# Patient Record
Sex: Male | Born: 1950
Health system: Southern US, Community
[De-identification: ages and names within clinical notes are randomized; demographics above are authoritative.]

## PROBLEM LIST (undated history)

## (undated) DIAGNOSIS — C61 Malignant neoplasm of prostate: Secondary | ICD-10-CM

## (undated) DIAGNOSIS — N400 Enlarged prostate without lower urinary tract symptoms: Secondary | ICD-10-CM

## (undated) DIAGNOSIS — K4020 Bilateral inguinal hernia, without obstruction or gangrene, not specified as recurrent: Secondary | ICD-10-CM

## (undated) DIAGNOSIS — Z960 Presence of urogenital implants: Secondary | ICD-10-CM

## (undated) HISTORY — PX: APPENDECTOMY: SHX54

## (undated) HISTORY — PX: NEPHRECTOMY: SHX65

---

## 1978-11-04 HISTORY — PX: HERNIA REPAIR: SHX51

## 2016-12-09 DIAGNOSIS — H3589 Other specified retinal disorders: Secondary | ICD-10-CM | POA: Diagnosis not present

## 2016-12-09 DIAGNOSIS — H40023 Open angle with borderline findings, high risk, bilateral: Secondary | ICD-10-CM | POA: Diagnosis not present

## 2016-12-09 DIAGNOSIS — H25043 Posterior subcapsular polar age-related cataract, bilateral: Secondary | ICD-10-CM | POA: Diagnosis not present

## 2016-12-09 DIAGNOSIS — H11153 Pinguecula, bilateral: Secondary | ICD-10-CM | POA: Diagnosis not present

## 2016-12-09 DIAGNOSIS — H40033 Anatomical narrow angle, bilateral: Secondary | ICD-10-CM | POA: Diagnosis not present

## 2016-12-09 DIAGNOSIS — H25013 Cortical age-related cataract, bilateral: Secondary | ICD-10-CM | POA: Diagnosis not present

## 2016-12-09 DIAGNOSIS — H18413 Arcus senilis, bilateral: Secondary | ICD-10-CM | POA: Diagnosis not present

## 2016-12-09 DIAGNOSIS — H2513 Age-related nuclear cataract, bilateral: Secondary | ICD-10-CM | POA: Diagnosis not present

## 2016-12-09 DIAGNOSIS — H353131 Nonexudative age-related macular degeneration, bilateral, early dry stage: Secondary | ICD-10-CM | POA: Diagnosis not present

## 2016-12-09 DIAGNOSIS — H11423 Conjunctival edema, bilateral: Secondary | ICD-10-CM | POA: Diagnosis not present

## 2016-12-31 DIAGNOSIS — H40013 Open angle with borderline findings, low risk, bilateral: Secondary | ICD-10-CM | POA: Diagnosis not present

## 2016-12-31 DIAGNOSIS — H40033 Anatomical narrow angle, bilateral: Secondary | ICD-10-CM | POA: Diagnosis not present

## 2016-12-31 DIAGNOSIS — H2513 Age-related nuclear cataract, bilateral: Secondary | ICD-10-CM | POA: Diagnosis not present

## 2017-07-14 DIAGNOSIS — H40013 Open angle with borderline findings, low risk, bilateral: Secondary | ICD-10-CM | POA: Diagnosis not present

## 2017-07-14 DIAGNOSIS — H2513 Age-related nuclear cataract, bilateral: Secondary | ICD-10-CM | POA: Diagnosis not present

## 2017-07-14 DIAGNOSIS — H40033 Anatomical narrow angle, bilateral: Secondary | ICD-10-CM | POA: Diagnosis not present

## 2017-11-03 ENCOUNTER — Other Ambulatory Visit: Payer: Self-pay

## 2017-11-03 ENCOUNTER — Emergency Department (HOSPITAL_COMMUNITY)
Admission: EM | Admit: 2017-11-03 | Discharge: 2017-11-03 | Disposition: A | Discharge status: Home / Self Care | Payer: Medicare Other | Attending: Emergency Medicine | Admitting: Emergency Medicine

## 2017-11-03 ENCOUNTER — Encounter (HOSPITAL_COMMUNITY): Payer: Self-pay | Admitting: Emergency Medicine

## 2017-11-03 DIAGNOSIS — Z79899 Other long term (current) drug therapy: Secondary | ICD-10-CM | POA: Insufficient documentation

## 2017-11-03 DIAGNOSIS — R338 Other retention of urine: Secondary | ICD-10-CM | POA: Diagnosis not present

## 2017-11-03 DIAGNOSIS — R339 Retention of urine, unspecified: Secondary | ICD-10-CM | POA: Diagnosis not present

## 2017-11-03 DIAGNOSIS — N401 Enlarged prostate with lower urinary tract symptoms: Secondary | ICD-10-CM | POA: Diagnosis not present

## 2017-11-03 DIAGNOSIS — R1030 Lower abdominal pain, unspecified: Secondary | ICD-10-CM | POA: Diagnosis present

## 2017-11-03 DIAGNOSIS — R39198 Other difficulties with micturition: Secondary | ICD-10-CM | POA: Insufficient documentation

## 2017-11-03 HISTORY — DX: Benign prostatic hyperplasia without lower urinary tract symptoms: N40.0

## 2017-11-03 LAB — URINALYSIS, ROUTINE W REFLEX MICROSCOPIC
BILIRUBIN URINE: NEGATIVE
GLUCOSE, UA: NEGATIVE mg/dL
Ketones, ur: 5 mg/dL — AB
LEUKOCYTES UA: NEGATIVE
NITRITE: NEGATIVE
PH: 6 (ref 5.0–8.0)
Protein, ur: NEGATIVE mg/dL
SPECIFIC GRAVITY, URINE: 1.012 (ref 1.005–1.030)
SQUAMOUS EPITHELIAL / LPF: NONE SEEN

## 2017-11-03 LAB — BASIC METABOLIC PANEL
Anion gap: 9 (ref 5–15)
BUN: 13 mg/dL (ref 6–20)
CO2: 24 mmol/L (ref 22–32)
CREATININE: 0.95 mg/dL (ref 0.61–1.24)
Calcium: 9.2 mg/dL (ref 8.9–10.3)
Chloride: 103 mmol/L (ref 101–111)
GFR calc non Af Amer: >60 mL/min (ref >60)
Glucose, Bld: 118 mg/dL — ABNORMAL HIGH (ref 65–99)
Potassium: 4 mmol/L (ref 3.5–5.1)
Sodium: 136 mmol/L (ref 135–145)

## 2017-11-03 LAB — CBC
HCT: 42.6 % (ref 39.0–52.0)
Hemoglobin: 14.8 g/dL (ref 13.0–17.0)
MCH: 32.9 pg (ref 26.0–34.0)
MCHC: 34.7 g/dL (ref 30.0–36.0)
MCV: 94.7 fL (ref 78.0–100.0)
PLATELETS: 212 10*3/uL (ref 150–400)
RBC: 4.5 MIL/uL (ref 4.22–5.81)
RDW: 13 % (ref 11.5–15.5)
WBC: 10.2 10*3/uL (ref 4.0–10.5)

## 2017-11-03 MED ORDER — TAMSULOSIN HCL 0.4 MG PO CAPS: 0.4000 mg | ORAL_CAPSULE | Freq: Every day | ORAL | 0 refills | Status: AC

## 2017-11-03 NOTE — ED Notes (Signed)
Pt standard drainage bag removed and leg bag applied. Foley care reviewed with pt and spouse.

## 2017-11-03 NOTE — ED Triage Notes (Signed)
Pt reports hx of prostate enlargement with some issues urinating usually early in the am, reports this am around 0500 had a small dribble but has not been able to urinate since, c/o pressure to lower abdomen.

## 2017-11-03 NOTE — ED Provider Notes (Signed)
[facility] EMERGENCY DEPARTMENT Provider Note   CSN: [id number] Arrival date & time: 11/03/17  1341     History   Chief Complaint Chief Complaint  Patient presents with  . Urinary Retention    HPI Phillip Brown is a 66 y.o. male.  HPI  65 year old male presents with urinary retention. Patient has not been able to urinate since 3 am this morning. Has had increasing trouble urinating for 2-3 days. Has chronic prostate problems, treats this with herbal meds. No dysuria or hematuria. Has lower abdominal pain/discomfort, worse with laying or sitting. No back/flank pain. No fevers. Has never had urinary retention before.   Past Medical History:  Diagnosis Date  . BPH (benign prostatic hyperplasia)     There are no active problems to display for this patient.   Past Surgical History:  Procedure Laterality Date  . APPENDECTOMY    . HERNIA REPAIR         Home Medications    Prior to Admission medications   Medication Sig Start Date End Date Taking? Authorizing Provider  acetaminophen (TYLENOL) 500 MG tablet Take 500-1,000 mg by mouth every 6 (six) hours as needed (for pain or headaches).   Yes [provider]  Multiple Vitamins-Minerals (ONE-A-DAY MENS 50+ ADVANTAGE) TABS Take by mouth.   Yes [provider]  Multiple Vitamins-Minerals (PRESERVISION AREDS) CAPS Take 1 capsule by mouth daily.   Yes [provider]  Multiple Vitamins-Minerals (ZINC PO) Take 1 tablet by mouth 3 (three) times a week.   Yes [provider]  Saw Palmetto, Serenoa repens, (SAW PALMETTO PO) Take 2 capsules by mouth daily.   Yes [provider]    Family History No family history on file.  Social History Social History   Tobacco Use  . Smoking status: Not on file  Substance Use Topics  . Alcohol use: Not on file  . Drug use: Not on file     Allergies   Patient has no known allergies.   Review of Systems Review of  Systems  Constitutional: Negative for fever.  Gastrointestinal: Positive for abdominal pain. Negative for nausea and vomiting.  Genitourinary: Positive for difficulty urinating. Negative for dysuria, flank pain, hematuria, penile pain, penile swelling, scrotal swelling and testicular pain.  Musculoskeletal: Negative for back pain.  All other systems reviewed and are negative.    Physical Exam Updated Vital Signs BP (!) 160/128 (BP Location: Left Arm)   Pulse (!) 119   Temp 98.6 F (37 C) (Oral)   Resp 20   SpO2 99%   Physical Exam  Constitutional: He is oriented to person, place, and time. He appears well-developed and well-nourished.  HENT:  Head: Normocephalic and atraumatic.  Right Ear: External ear normal.  Left Ear: External ear normal.  Nose: Nose normal.  Eyes: Right eye exhibits no discharge. Left eye exhibits no discharge.  Neck: Neck supple.  Cardiovascular: Normal rate, regular rhythm and normal heart sounds.  Pulmonary/Chest: Effort normal and breath sounds normal.  Abdominal: Soft. There is tenderness (mild) in the suprapubic area.  Genitourinary: Penis normal. Right testis shows no swelling and no tenderness. Left testis shows no swelling and no tenderness. Circumcised. No penile erythema or penile tenderness. No discharge found.  Musculoskeletal: He exhibits no edema.  Neurological: He is alert and oriented to person, place, and time.  Skin: Skin is warm and dry.  Nursing note and vitals reviewed.    ED Treatments / Results  Labs (  all labs ordered are listed, but only abnormal results are displayed) Labs Reviewed  URINALYSIS, ROUTINE W REFLEX MICROSCOPIC - Abnormal; Notable for the following components:      Result Value   Hgb urine dipstick MODERATE (*)    Ketones, ur 5 (*)    Bacteria, UA RARE (*)    All other components within normal limits  BASIC METABOLIC PANEL - Abnormal; Notable for the following components:   Glucose, Bld 118 (*)    All other  components within normal limits  URINE CULTURE  CBC    EKG  EKG Interpretation None       Radiology No results found.  Procedures Procedures (including critical care time)  Medications Ordered in ED Medications - No data to display   Initial Impression / Assessment and Plan / ED Course  I have reviewed the triage vital signs and the nursing notes.  Pertinent labs & imaging results that were available during my care of the patient were reviewed by me and considered in my medical decision making (see chart for details).     Patient symptoms resolved with Foley catheter placement with over 500 cc of urine coming out.  Has complete relief of symptoms.  Tachycardia likely from the discomfort as this is also resolved.  His lab work and urinalysis are unremarkable besides some moderate blood on catheterized urine.  Unclear if this was from the catheter itself as he does not appear to have significant gross hematuria.  Placed on Flomax and follow-up with urology in about a week.  He will be given a leg bag.  This is likely from prostatic hypertrophy.  Return precautions.  Final Clinical Impressions(s) / ED Diagnoses   Final diagnoses:  Acute urinary retention    ED Discharge Orders    None       Sherwood Gambler, MD 11/04/17 0001

## 2017-11-03 NOTE — ED Notes (Signed)
Bladder scanned bladder; 699 cc.

## 2017-11-03 NOTE — ED Notes (Signed)
Pt brought to room went to rest room with cup given in triage to attempt to collect urine sample.

## 2017-11-03 NOTE — ED Notes (Signed)
Patient reports severe discomfort in lower abdomen, hasn't urinated since about 7am today. Patient very anxious, standing in waiting room, states it is too uncomfortable to sit down.

## 2017-11-05 LAB — URINE CULTURE: Culture: NO GROWTH

## 2017-11-10 DIAGNOSIS — R338 Other retention of urine: Secondary | ICD-10-CM | POA: Diagnosis not present

## 2017-11-11 DIAGNOSIS — R338 Other retention of urine: Secondary | ICD-10-CM | POA: Diagnosis not present

## 2017-12-05 DIAGNOSIS — R972 Elevated prostate specific antigen [PSA]: Secondary | ICD-10-CM | POA: Diagnosis not present

## 2017-12-05 DIAGNOSIS — R338 Other retention of urine: Secondary | ICD-10-CM | POA: Diagnosis not present

## 2017-12-07 ENCOUNTER — Emergency Department (HOSPITAL_COMMUNITY)
Admission: EM | Admit: 2017-12-07 | Discharge: 2017-12-07 | Disposition: A | Discharge status: Home / Self Care | Payer: Medicare Other | Attending: Emergency Medicine | Admitting: Emergency Medicine

## 2017-12-07 ENCOUNTER — Encounter (HOSPITAL_COMMUNITY): Payer: Self-pay | Admitting: *Deleted

## 2017-12-07 ENCOUNTER — Other Ambulatory Visit: Payer: Self-pay

## 2017-12-07 DIAGNOSIS — R339 Retention of urine, unspecified: Secondary | ICD-10-CM | POA: Insufficient documentation

## 2017-12-07 DIAGNOSIS — Z79899 Other long term (current) drug therapy: Secondary | ICD-10-CM | POA: Diagnosis not present

## 2017-12-07 LAB — URINALYSIS, ROUTINE W REFLEX MICROSCOPIC
BILIRUBIN URINE: NEGATIVE
Glucose, UA: NEGATIVE mg/dL
KETONES UR: NEGATIVE mg/dL
LEUKOCYTES UA: NEGATIVE
NITRITE: NEGATIVE
PH: 6 (ref 5.0–8.0)
PROTEIN: NEGATIVE mg/dL
Specific Gravity, Urine: 1.006 (ref 1.005–1.030)

## 2017-12-07 NOTE — ED Triage Notes (Signed)
Pt self caths, unable to advance cath this am, bladder spasms, pt called urology who in return called here requesting a cath be placed, suggested 16-18 coude cath.

## 2017-12-07 NOTE — ED Provider Notes (Signed)
[facility] DEPT Provider Note  CSN: [id number] Arrival date & time: 12/07/17 0109  Chief Complaint(s) Urinary Retention  HPI Phillip Brown is a 67 y.o. male with a history of BPH and urinary retention who has been self cathing for 2 days presents to the emergency department for inability to self cath and urinary retention.  Patient called on-call urologist who recommended he present to the emergency department for Foley placement.  Patient had a recent void challenge and urology 2 days ago which he passed.  States that he was given self cathing catheters by the urologist that were 47 [place] and he is noted that these cause more discomfort than the 12 [place] Foley that was placed and previously.  He is endorsing urethral spasms associated with recent self cath.  Denies any dysuria.  No abdominal pain or discomfort.  No nausea, vomiting.  No fevers or chills.  Denies any other physical complaints.  HPI  Past Medical History Past Medical History:  Diagnosis Date  . BPH (benign prostatic hyperplasia)    There are no active problems to display for this patient.  Home Medication(s) Prior to Admission medications   Medication Sig Start Date End Date Taking? Authorizing Provider  acetaminophen (TYLENOL) 500 MG tablet Take 500-1,000 mg by mouth every 6 (six) hours as needed (for pain or headaches).    [provider]  Multiple Vitamins-Minerals (ONE-A-DAY MENS 50+ ADVANTAGE) TABS Take by mouth.    [provider]  Multiple Vitamins-Minerals (PRESERVISION AREDS) CAPS Take 1 capsule by mouth daily.    [provider]  Multiple Vitamins-Minerals (ZINC PO) Take 1 tablet by mouth 3 (three) times a week.    [provider]  Saw Palmetto, Serenoa repens, (SAW PALMETTO PO) Take 2 capsules by mouth daily.    [provider]  tamsulosin (FLOMAX) 0.4 MG CAPS capsule Take 1 capsule (0.4 mg total) by mouth daily. 11/03/17    Sherwood Gambler, MD                                                                                                                                    Past Surgical History Past Surgical History:  Procedure Laterality Date  . APPENDECTOMY    . HERNIA REPAIR     Family History No family history on file.  Social History Social History   Tobacco Use  . Smoking status: Never Smoker  . Smokeless tobacco: Never Used  Substance Use Topics  . Alcohol use: Yes    Comment: rarely  . Drug use: No   Allergies Patient has no known allergies.  Review of Systems Review of Systems All other systems are reviewed and are negative for acute change except as noted in the HPI  Physical Exam Vital Signs  I have reviewed the triage vital signs BP 125/83 (BP Location: Right Arm)   Pulse 88   Resp 18   Ht  5' 10.5" (1.791 m)   Wt 74.4 kg (164 lb)   SpO2 100%   BMI 23.20 kg/m   Physical Exam  Constitutional: He is oriented to person, place, and time. He appears well-developed and well-nourished. No distress.  HENT:  Head: Normocephalic and atraumatic.  Right Ear: External ear normal.  Left Ear: External ear normal.  Nose: Nose normal.  Mouth/Throat: Mucous membranes are normal. No trismus in the jaw.  Eyes: Conjunctivae and EOM are normal. No scleral icterus.  Neck: Normal range of motion and phonation normal.  Cardiovascular: Normal rate and regular rhythm.  Pulmonary/Chest: Effort normal. No stridor. No respiratory distress.  Abdominal: He exhibits no distension. There is tenderness in the suprapubic area. There is no rigidity, no rebound and no guarding.  Musculoskeletal: Normal range of motion. He exhibits no edema.  Neurological: He is alert and oriented to person, place, and time.  Skin: He is not diaphoretic.  Psychiatric: He has a normal mood and affect. His behavior is normal.  Vitals reviewed.   ED Results and Treatments Labs (all labs ordered are listed, but only  abnormal results are displayed) Labs Reviewed  URINALYSIS, ROUTINE W REFLEX MICROSCOPIC - Abnormal; Notable for the following components:      Result Value   Hgb urine dipstick MODERATE (*)    Bacteria, UA RARE (*)    Squamous Epithelial / LPF 0-5 (*)    All other components within normal limits                                                                                                                         EKG  EKG Interpretation  Date/Time:    Ventricular Rate:    PR Interval:    QRS Duration:   QT Interval:    QTC Calculation:   R Axis:     Text Interpretation:        Radiology No results found. Pertinent labs & imaging results that were available during my care of the patient were reviewed by me and considered in my medical decision making (see chart for details).  Medications Ordered in ED Medications - No data to display                                                                                                                                  Procedures Procedures  (including critical care time)  Medical Decision Making /  ED Course I have reviewed the nursing notes for this encounter and the patient's prior records (if available in EHR or on provided paperwork).    Urinary retention. 12 fr foley inserted with return of clear urine. UA reassuring and w/o infection. Urology follow up.  Final Clinical Impression(s) / ED Diagnoses Final diagnoses:  Urinary retention   Disposition: Discharge  Condition: Good  I have discussed the results, Dx and Tx plan with the patient who expressed understanding and agree(s) with the plan. Discharge instructions discussed at great length. The patient was given strict return precautions who verbalized understanding of the instructions. No further questions at time of discharge.    ED Discharge Orders    None       Follow Up: Lucas Mallow, MD [place]  [id number] [phone]  Schedule an appointment as soon as possible for a visit        This chart was dictated using voice recognition software.  Despite best efforts to proofread,  errors can occur which can change the documentation meaning.   Fatima Blank, MD 12/07/17 1052

## 2017-12-07 NOTE — ED Notes (Signed)
Pt and family updated on plan of care  

## 2017-12-07 NOTE — ED Notes (Signed)
BLADDER SCAN: 800ML

## 2017-12-07 NOTE — ED Notes (Signed)
Pt instructed with foley care, discharged home

## 2017-12-12 DIAGNOSIS — R972 Elevated prostate specific antigen [PSA]: Secondary | ICD-10-CM | POA: Diagnosis not present

## 2017-12-12 DIAGNOSIS — C61 Malignant neoplasm of prostate: Secondary | ICD-10-CM | POA: Diagnosis not present

## 2017-12-23 ENCOUNTER — Other Ambulatory Visit: Payer: Self-pay | Admitting: Urology

## 2017-12-23 DIAGNOSIS — C61 Malignant neoplasm of prostate: Secondary | ICD-10-CM

## 2017-12-23 DIAGNOSIS — R338 Other retention of urine: Secondary | ICD-10-CM | POA: Diagnosis not present

## 2018-01-07 DIAGNOSIS — R338 Other retention of urine: Secondary | ICD-10-CM | POA: Diagnosis not present

## 2018-01-09 ENCOUNTER — Encounter (HOSPITAL_COMMUNITY)
Admission: RE | Admit: 2018-01-09 | Discharge: 2018-01-09 | Disposition: A | Discharge status: Home / Self Care | Payer: Medicare Other | Source: Ambulatory Visit | Attending: Urology | Admitting: Urology

## 2018-01-09 DIAGNOSIS — C61 Malignant neoplasm of prostate: Secondary | ICD-10-CM | POA: Diagnosis not present

## 2018-01-14 DIAGNOSIS — R338 Other retention of urine: Secondary | ICD-10-CM | POA: Diagnosis not present

## 2018-01-14 DIAGNOSIS — Z5111 Encounter for antineoplastic chemotherapy: Secondary | ICD-10-CM | POA: Diagnosis not present

## 2018-01-14 DIAGNOSIS — K4091 Unilateral inguinal hernia, without obstruction or gangrene, recurrent: Secondary | ICD-10-CM | POA: Diagnosis not present

## 2018-01-14 DIAGNOSIS — C61 Malignant neoplasm of prostate: Secondary | ICD-10-CM | POA: Diagnosis not present

## 2018-01-16 DIAGNOSIS — H524 Presbyopia: Secondary | ICD-10-CM | POA: Diagnosis not present

## 2018-01-16 DIAGNOSIS — H40013 Open angle with borderline findings, low risk, bilateral: Secondary | ICD-10-CM | POA: Diagnosis not present

## 2018-01-16 DIAGNOSIS — H52223 Regular astigmatism, bilateral: Secondary | ICD-10-CM | POA: Diagnosis not present

## 2018-01-16 DIAGNOSIS — H11423 Conjunctival edema, bilateral: Secondary | ICD-10-CM | POA: Diagnosis not present

## 2018-01-16 DIAGNOSIS — H18413 Arcus senilis, bilateral: Secondary | ICD-10-CM | POA: Diagnosis not present

## 2018-01-16 DIAGNOSIS — H5213 Myopia, bilateral: Secondary | ICD-10-CM | POA: Diagnosis not present

## 2018-01-16 DIAGNOSIS — H11823 Conjunctivochalasis, bilateral: Secondary | ICD-10-CM | POA: Diagnosis not present

## 2018-01-16 DIAGNOSIS — H25013 Cortical age-related cataract, bilateral: Secondary | ICD-10-CM | POA: Diagnosis not present

## 2018-01-16 DIAGNOSIS — H11153 Pinguecula, bilateral: Secondary | ICD-10-CM | POA: Diagnosis not present

## 2018-01-16 DIAGNOSIS — H2513 Age-related nuclear cataract, bilateral: Secondary | ICD-10-CM | POA: Diagnosis not present

## 2018-02-03 ENCOUNTER — Ambulatory Visit: Payer: Self-pay | Admitting: Surgery

## 2018-02-03 DIAGNOSIS — K4021 Bilateral inguinal hernia, without obstruction or gangrene, recurrent: Secondary | ICD-10-CM | POA: Diagnosis not present

## 2018-02-03 DIAGNOSIS — K449 Diaphragmatic hernia without obstruction or gangrene: Secondary | ICD-10-CM | POA: Diagnosis not present

## 2018-02-03 DIAGNOSIS — Z01818 Encounter for other preprocedural examination: Secondary | ICD-10-CM | POA: Diagnosis not present

## 2018-02-03 DIAGNOSIS — M6208 Separation of muscle (nontraumatic), other site: Secondary | ICD-10-CM | POA: Diagnosis not present

## 2018-02-03 DIAGNOSIS — K219 Gastro-esophageal reflux disease without esophagitis: Secondary | ICD-10-CM | POA: Diagnosis not present

## 2018-02-03 NOTE — H&P (Signed)
Phillip Brown Documented: 02/03/2018 8:55 AM Location: [facility] Patient #: [id number] DOB: 04-Sep-1951 Married / Language: Cleophus Molt / Race: White Male  History of Present Illness Adin Hector MD; 02/03/2018 10:25 AM) The patient is a 67 year old male who presents with an inguinal hernia. Note for "Inguinal hernia": ` ` ` Patient sent for surgical consultation at the request of Dr. Link Snuffer, [facility]  Chief Complaint: Recurrent inguinal hernias in the setting of urinary retention and prostate cancer  The patient is a pleasant gentleman who tends to avoid doctors. Had worsening urinary difficulties. Got to the point he had to be catheterized in the emergency room. This started in early January. Had follow-up with urology. Workup showed evidence of prostate cancer. Some nodal metastasis. Felt not to be a surgical candidate for prostatectomy. On hormonal suppression therapy for now. I am not able to get the catheter out. Patient had history of left inguinal hernia repair by Burgess Amor in the 1980s and an open fashion. Noted a lump in his right side consistent with a right inguinal hernia. That hernia has gradually gotten larger but really didn't notice much in the different from either side. Did not really seem to bother him. However with his prostate cancer workup it was noted that a significant part of the dome of his bladder was herniating into his left side concerning for a recurrent left inguinal hernia as well. Surgical consultation recommended to conceive hernia repair skin help reposition his bladder and help get his catheter out.  He comes today with his wife. Apparently he had an atrophic right kidney that was excised when he was 67 years old. Incidental appendectomy done. That was the 1950s. No other abdominal surgery. The usually do 20 minute walks pretty regularly. He moves his bowels at least once if not twice a day. He's never had a  colonoscopy. Tends not to see Dr. Lauro Franklin for an urgent setting. He does not smoke. He is not diabetic. No cardiopulmonary issues or strokes that he knows of.   (Review of systems as stated in this history (HPI) or in the review of systems. Otherwise all other 12 point ROS are negative) ` ` `   Past Surgical History Levonne Spiller, CMA; 02/03/2018 8:56 AM) Appendectomy Nephrectomy Left. Open Inguinal Hernia Surgery Left. Oral Surgery  Diagnostic Studies History Levonne Spiller, CMA; 02/03/2018 8:56 AM) Colonoscopy never  Allergies Andee Poles Gerrigner, CMA; 02/03/2018 8:56 AM) No Known Allergies [02/03/2018]: Allergies Reconciled  Medication History Levonne Spiller, CMA; 02/03/2018 8:57 AM) Multiple Vitamin (Oral) Active. Medications Reconciled  Social History Andee Poles Education officer, museum, CMA; 02/03/2018 8:56 AM) Alcohol use Occasional alcohol use. Caffeine use Coffee. No drug use Tobacco use Never smoker.  Family History Levonne Spiller, [place]; 02/03/2018 8:56 AM) Colon Cancer Mother. Hypertension Mother. Melanoma Father. Thyroid problems Daughter.  Other Problems Levonne Spiller, CMA; 02/03/2018 8:56 AM) Bladder Problems Enlarged Prostate Inguinal Hernia Prostate Cancer     Review of Systems Andee Poles Gerrigner CMA; 02/03/2018 8:56 AM) General Present- Fatigue and Night Sweats. Not Present- Appetite Loss, Chills, Fever, Weight Gain and Weight Loss. Skin Not Present- Change in Wart/Mole, Dryness, Hives, Jaundice, New Lesions, Non-Healing Wounds, Rash and Ulcer. HEENT Present- Wears glasses/contact lenses. Not Present- Earache, Hearing Loss, Hoarseness, Nose Bleed, Oral Ulcers, Ringing in the Ears, Seasonal Allergies, Sinus Pain, Sore Throat, Visual Disturbances and Yellow Eyes. Respiratory Present- Snoring. Not Present- Bloody sputum, Chronic Cough, Difficulty Breathing and Wheezing. Breast Not Present- Breast Mass, Breast Pain, Nipple  Discharge  and Skin Changes. Cardiovascular Not Present- Chest Pain, Difficulty Breathing Lying Down, Leg Cramps, Palpitations, Rapid Heart Rate, Shortness of Breath and Swelling of Extremities. Gastrointestinal Not Present- Abdominal Pain, Bloating, Bloody Stool, Change in Bowel Habits, Chronic diarrhea, Constipation, Difficulty Swallowing, Excessive gas, Gets full quickly at meals, Hemorrhoids, Indigestion, Nausea, Rectal Pain and Vomiting. Male Genitourinary Not Present- Blood in Urine, Change in Urinary Stream, Frequency, Impotence, Nocturia, Painful Urination, Urgency and Urine Leakage.  Vitals (Danielle Gerrigner CMA; 02/03/2018 8:57 AM) 02/03/2018 8:57 AM Weight: 165 lb Height: 71in Body Surface Area: 1.94 m Body Mass Index: 23.01 kg/m  Temp.: 97.72F(Oral)  Pulse: 84 (Regular)  BP: 136/80 (Sitting, Left Arm, Standard)      Physical Exam Adin Hector MD; 02/03/2018 9:32 AM)  General Mental Status-Alert. General Appearance-Not in acute distress, Not Sickly. Orientation-Oriented X3. Hydration-Well hydrated. Voice-Normal.  Integumentary Global Assessment Upon inspection and palpation of skin surfaces of the - Axillae: non-tender, no inflammation or ulceration, no drainage. and Distribution of scalp and body hair is normal. General Characteristics Temperature - normal warmth is noted.  Head and Neck Head-normocephalic, atraumatic with no lesions or palpable masses. Face Global Assessment - atraumatic, no absence of expression. Neck Global Assessment - no abnormal movements, no bruit auscultated on the right, no bruit auscultated on the left, no decreased range of motion, non-tender. Trachea-midline. Thyroid Gland Characteristics - non-tender.  Eye Eyeball - Left-Extraocular movements intact, No Nystagmus. Eyeball - Right-Extraocular movements intact, No Nystagmus. Cornea - Left-No Hazy. Cornea - Right-No Hazy. Sclera/Conjunctiva -  Left-No scleral icterus, No Discharge. Sclera/Conjunctiva - Right-No scleral icterus, No Discharge. Pupil - Left-Direct reaction to light normal. Pupil - Right-Direct reaction to light normal. Note: Wears glasses. Vision corrected  ENMT Ears Pinna - Left - no drainage observed, no generalized tenderness observed. Right - no drainage observed, no generalized tenderness observed. Nose and Sinuses External Inspection of the Nose - no destructive lesion observed. Inspection of the nares - Left - quiet respiration. Right - quiet respiration. Mouth and Throat Lips - Upper Lip - no fissures observed, no pallor noted. Lower Lip - no fissures observed, no pallor noted. Nasopharynx - no discharge present. Oral Cavity/Oropharynx - Tongue - no dryness observed. Oral Mucosa - no cyanosis observed. Hypopharynx - no evidence of airway distress observed.  Chest and Lung Exam Inspection Movements - Normal and Symmetrical. Accessory muscles - No use of accessory muscles in breathing. Palpation Palpation of the chest reveals - Non-tender. Auscultation Breath sounds - Normal and Clear.  Cardiovascular Auscultation Rhythm - Regular. Murmurs & Other Heart Sounds - Auscultation of the heart reveals - No Murmurs and No Systolic Clicks.  Abdomen Inspection Inspection of the abdomen reveals - No Visible peristalsis and No Abnormal pulsations. Umbilicus - No Bleeding, No Urine drainage. Palpation/Percussion Palpation and Percussion of the abdomen reveal - Soft, Non Tender, No Rebound tenderness, No Rigidity (guarding) and No Cutaneous hyperesthesia. Note: Abdomen soft. Nontender. Not distended. No umbilical or incisional hernias. Mild diastases recti. Low midline incision without any hernia. No guarding.  Male Genitourinary Sexual Maturity Tanner 5 - Adult hair pattern and Adult penile size and shape. Note: Bilateral large inguinal hernias to groins but not down to scrotum. Sensitive but  reducible.  Catheter in place. Normal phallus. No testicular masses.  Peripheral Vascular Upper Extremity Inspection - Left - No Cyanotic nailbeds, Not Ischemic. Right - No Cyanotic nailbeds, Not Ischemic.  Neurologic Neurologic evaluation reveals -normal attention span and ability to concentrate, able to name objects  and repeat phrases. Appropriate fund of knowledge , normal sensation and normal coordination. Mental Status Affect - not angry, not paranoid. Cranial Nerves-Normal Bilaterally. Gait-Normal.  Neuropsychiatric Mental status exam performed with findings of-able to articulate well with normal speech/language, rate, volume and coherence, thought content normal with ability to perform basic computations and apply abstract reasoning and no evidence of hallucinations, delusions, obsessions or homicidal/suicidal ideation.  Musculoskeletal Global Assessment Spine, Ribs and Pelvis - no instability, subluxation or laxity. Right Upper Extremity - no instability, subluxation or laxity.  Lymphatic Head & Neck  General Head & Neck Lymphatics: Bilateral - Description - No Localized lymphadenopathy. Axillary  General Axillary Region: Bilateral - Description - No Localized lymphadenopathy. Femoral & Inguinal  Generalized Femoral & Inguinal Lymphatics: Left - Description - No Localized lymphadenopathy. Right - Description - No Localized lymphadenopathy.    Assessment & Plan Adin Hector MD; 02/03/2018 9:37 AM)  BILATERAL RECURRENT INGUINAL HERNIA WITHOUT OBSTRUCTION OR GANGRENE (K40.21) Impression: Moderate large inguinal hernias prior to groins. Right side containing omental fat. Left side containing probably half of the bladder. I think he would benefit from repair. We do laparoscopic underlay repair with mesh. Most likely 3 sheets. Most likely normal density Bard given the larger defects. The patient's how large the defects are seen laparoscopically. These are  recurrent.  I called discussed with the patient's urologist, Dr. Link Snuffer. Patient has metastatic prostate cancer. The plan is palliative hormonal therapy. Perhaps radiation in the future but not now. Highly unlikely that he is a candidate for prostatectomy since he has node positive disease.  The hope is once the hernias are reduced and the bladder as a normal positioning and the prostate cancers treated, his ability urinate will improve and the catheter can be removed. I will defer to Dr. Gloriann Loan of that.  Patient's daughter is getting married in about 6 weeks. I offered to see if we could repair this in the next couple weeks give him a month to recover. He is leaning more towards waiting until after the wedding since his wife is very busy working as well. I think he can wait as long as he does not have worsening symptoms. Unlikely that the hernias will become strangulated with only omentum and bladder in them over the next few months. We will see.   PREOP - ING HERNIA - ENCOUNTER FOR PREOPERATIVE EXAMINATION FOR GENERAL SURGICAL PROCEDURE (Z01.818)  Current Plans You are being scheduled for surgery- Our schedulers will call you.  You should hear from our office's scheduling department within 5 working days about the location, date, and time of surgery. We try to make accommodations for patient's preferences in scheduling surgery, but sometimes the OR schedule or the surgeon's schedule prevents [place] from making those accommodations.  If you have not heard from our office [phone]) in 5 working days, call the office and ask for your surgeon's nurse.  If you have other questions about your diagnosis, plan, or surgery, call the office and ask for your surgeon's nurse.  Written instructions provided The anatomy & physiology of the abdominal wall and pelvic floor was discussed. The pathophysiology of hernias in the inguinal and pelvic region was discussed. Natural history risks such as  progressive enlargement, pain, incarceration, and strangulation was discussed. Contributors to complications such as smoking, obesity, diabetes, prior surgery, etc were discussed.  I feel the risks of no intervention will lead to serious problems that outweigh the operative risks; therefore, I recommended surgery to reduce and repair  the hernia. I explained laparoscopic techniques with possible need for an open approach. I noted usual use of mesh to patch and/or buttress hernia repair  Risks such as bleeding, infection, abscess, need for further treatment, heart attack, death, and other risks were discussed. I noted a good likelihood this will help address the problem. Goals of post-operative recovery were discussed as well. Possibility that this will not correct all symptoms was explained. I stressed the importance of low-impact activity, aggressive pain control, avoiding constipation, & not pushing through pain to minimize risk of post-operative chronic pain or injury. Possibility of reherniation was discussed. We will work to minimize complications.  An educational handout further explaining the pathology & treatment options was given as well. Questions were answered. The patient expresses understanding & wishes to proceed with surgery.  Pt Education - Pamphlet Given - Laparoscopic Hernia Repair: discussed with patient and provided information. Pt Education - CCS Pain Control (Recia Sons) Pt Education - [facility] Hernia Post-Op HCI (Caydence Koenig): discussed with patient and provided information. Pt Education - [facility] Mesh education: discussed with patient and provided information.  HIATAL HERNIA WITH GERD (K21.9) Impression: Moderate size hiatal hernia. Only some mild heartburn usually controlled with over-the-counter medications.  Would not try to do anything more aggressive fix at this time. Certainly prostate cancer and inguinal hernias and Foley issues are the higher priority. He consider seeing  gastroenterology to evaluate that in addition to recommended screening colonoscopy since she sages over 50 any hernias 1 cancer. He needs to get established with a regular primary care physician.   DIASTASIS RECTI (M62.08)  Current Plans Pt Education - CCS Diastasis Recti: discussed with patient and provided information  Adin Hector, M.D., F.A.C.S. Gastrointestinal and Minimally Invasive Surgery [facility] [address] [place], [place] [address] [phone] Main / Paging

## 2018-02-13 DIAGNOSIS — C61 Malignant neoplasm of prostate: Secondary | ICD-10-CM | POA: Diagnosis not present

## 2018-02-19 DIAGNOSIS — R338 Other retention of urine: Secondary | ICD-10-CM | POA: Diagnosis not present

## 2018-02-19 DIAGNOSIS — C61 Malignant neoplasm of prostate: Secondary | ICD-10-CM | POA: Diagnosis not present

## 2018-03-17 DIAGNOSIS — R338 Other retention of urine: Secondary | ICD-10-CM | POA: Diagnosis not present

## 2018-04-15 NOTE — Patient Instructions (Signed)
Phillip Brown  04/15/2018   Your procedure is scheduled on: 04-23-18   Report to [facility] Main  Entrance    Report to admitting at 5:30AM    Call this number if you have problems the morning of surgery [phone]     Remember: NO SOLID FOOD AFTER MIDNIGHT THE NIGHT PRIOR TO SURGERY. NOTHING BY MOUTH EXCEPT CLEAR LIQUIDS UNTIL 3 HOURS PRIOR TO SCHEDULED SURGERY. PLEASE FINISH ENSURE DRINK PER SURGEON ORDER 3 HOURS PRIOR TO SCHEDULED SURGERY TIME WHICH NEEDS TO BE COMPLETED AT _____4:30AM_______.     CLEAR LIQUID DIET   Foods Allowed                                                                     Foods Excluded  Coffee and tea, regular and decaf                             liquids that you cannot  Plain Jell-O in any flavor                                             see through such as: Fruit ices (not with fruit pulp)                                     milk, soups, orange juice  Iced Popsicles                                    All solid food Carbonated beverages, regular and diet                                    Cranberry, grape and apple juices Sports drinks like Gatorade Lightly seasoned clear broth or consume(fat free) Sugar, honey syrup  Sample Menu Breakfast                                Lunch                                     Supper Cranberry juice                    Beef broth                            Chicken broth Jell-O                                     Grape juice  Apple juice Coffee or tea                        Jell-O                                      Popsicle                                                Coffee or tea                        Coffee or tea  _____________________________________________________________________    Take these medicines the morning of surgery with A SIP OF WATER: TYLENOL IF NEEDED                                You may not have any metal on your body including  hair pins and              piercings  Do not wear jewelry, make-up, lotions, powders or perfumes, deodorant              Men may shave face and neck.   Do not bring valuables to the hospital. [place].  Contacts, dentures or bridgework may not be worn into surgery.      Patients discharged the day of surgery will not be allowed to drive home.  Name and phone number of your driver:  Special Instructions: N/A              Please read over the following fact sheets you were given: _____________________________________________________________________             [facility] - Preparing for Surgery Before surgery, you can play an important role.  Because skin is not sterile, your skin needs to be as free of germs as possible.  You can reduce the number of germs on your skin by washing with CHG (chlorahexidine gluconate) soap before surgery.  CHG is an antiseptic cleaner which kills germs and bonds with the skin to continue killing germs even after washing. Please DO NOT use if you have an allergy to CHG or antibacterial soaps.  If your skin becomes reddened/irritated stop using the CHG and inform your nurse when you arrive at Short Stay. Do not shave (including legs and underarms) for at least 48 hours prior to the first CHG shower.  You may shave your face/neck. Please follow these instructions carefully:  1.  Shower with CHG Soap the night before surgery and the  morning of Surgery.  2.  If you choose to wash your hair, wash your hair first as usual with your  normal  shampoo.  3.  After you shampoo, rinse your hair and body thoroughly to remove the  shampoo.                           4.  Use CHG as you would any other liquid soap.  You can apply chg directly  to the skin and wash  Gently with a scrungie or clean washcloth.  5.  Apply the CHG Soap to your body ONLY FROM THE NECK DOWN.   Do not use on face/ open                            Wound or open sores. Avoid contact with eyes, ears mouth and genitals (private parts).                       Wash face,  Genitals (private parts) with your normal soap.             6.  Wash thoroughly, paying special attention to the area where your surgery  will be performed.  7.  Thoroughly rinse your body with warm water from the neck down.  8.  DO NOT shower/wash with your normal soap after using and rinsing off  the CHG Soap.                9.  Pat yourself dry with a clean towel.            10.  Wear clean pajamas.            11.  Place clean sheets on your bed the night of your first shower and do not  sleep with pets. Day of Surgery : Do not apply any lotions/deodorants the morning of surgery.  Please wear clean clothes to the hospital/surgery center.  FAILURE TO FOLLOW THESE INSTRUCTIONS MAY RESULT IN THE CANCELLATION OF YOUR SURGERY PATIENT SIGNATURE_________________________________  NURSE SIGNATURE__________________________________  ________________________________________________________________________

## 2018-04-16 ENCOUNTER — Encounter (HOSPITAL_COMMUNITY): Payer: Self-pay

## 2018-04-16 ENCOUNTER — Encounter (HOSPITAL_COMMUNITY)
Admission: RE | Admit: 2018-04-16 | Discharge: 2018-04-16 | Disposition: A | Discharge status: Home / Self Care | Payer: Medicare Other | Source: Ambulatory Visit | Attending: Surgery | Admitting: Surgery

## 2018-04-16 ENCOUNTER — Encounter (INDEPENDENT_AMBULATORY_CARE_PROVIDER_SITE_OTHER): Payer: Self-pay

## 2018-04-16 ENCOUNTER — Other Ambulatory Visit: Payer: Self-pay

## 2018-04-16 DIAGNOSIS — K4021 Bilateral inguinal hernia, without obstruction or gangrene, recurrent: Secondary | ICD-10-CM | POA: Insufficient documentation

## 2018-04-16 DIAGNOSIS — Z01818 Encounter for other preprocedural examination: Secondary | ICD-10-CM | POA: Insufficient documentation

## 2018-04-16 HISTORY — DX: Presence of urogenital implants: Z96.0

## 2018-04-16 HISTORY — DX: Bilateral inguinal hernia, without obstruction or gangrene, not specified as recurrent: K40.20

## 2018-04-16 HISTORY — DX: Malignant neoplasm of prostate: C61

## 2018-04-16 LAB — CBC
HCT: 41.6 % (ref 39.0–52.0)
Hemoglobin: 14.3 g/dL (ref 13.0–17.0)
MCH: 33.1 pg (ref 26.0–34.0)
MCHC: 34.4 g/dL (ref 30.0–36.0)
MCV: 96.3 fL (ref 78.0–100.0)
Platelets: 213 10*3/uL (ref 150–400)
RBC: 4.32 MIL/uL (ref 4.22–5.81)
RDW: 13.5 % (ref 11.5–15.5)
WBC: 5.1 10*3/uL (ref 4.0–10.5)

## 2018-04-16 LAB — BASIC METABOLIC PANEL
ANION GAP: 8 (ref 5–15)
BUN: 15 mg/dL (ref 6–20)
CALCIUM: 9.3 mg/dL (ref 8.9–10.3)
CO2: 28 mmol/L (ref 22–32)
CREATININE: 0.96 mg/dL (ref 0.61–1.24)
Chloride: 104 mmol/L (ref 101–111)
GFR calc non Af Amer: >60 mL/min (ref >60)
Glucose, Bld: 112 mg/dL — ABNORMAL HIGH (ref 65–99)
Potassium: 5.1 mmol/L (ref 3.5–5.1)
SODIUM: 140 mmol/L (ref 135–145)

## 2018-04-22 MED ORDER — GENTAMICIN SULFATE 40 MG/ML IJ SOLN
5.0000 mg/kg | INTRAVENOUS | Status: DC
  Filled 2018-04-22: qty 9.5

## 2018-04-22 NOTE — Anesthesia Preprocedure Evaluation (Addendum)
Anesthesia Evaluation  Patient identified by MRN, date of birth, ID band Patient awake    Reviewed: Allergy & Precautions, H&P , NPO status , Patient's Chart, lab work & pertinent test results, reviewed documented beta blocker date and time   Airway Mallampati: II  TM Distance: >3 FB Neck ROM: full    Dental no notable dental hx.    Pulmonary    Pulmonary exam normal breath sounds clear to auscultation       Cardiovascular Exercise Tolerance: Good  Rhythm:regular Rate:Normal     Neuro/Psych    GI/Hepatic   Endo/Other    Renal/GU      Musculoskeletal   Abdominal   Peds  Hematology   Anesthesia Other Findings Prostate CA  Reproductive/Obstetrics                            Anesthesia Physical Anesthesia Plan  ASA: II  Anesthesia Plan: General   Post-op Pain Management:    Induction:   PONV Risk Score and Plan: 2 and Treatment may vary due to age or medical condition, Ondansetron and Dexamethasone  Airway Management Planned: Oral ETT  Additional Equipment:   Intra-op Plan:   Post-operative Plan:   Informed Consent: I have reviewed the patients History and Physical, chart, labs and discussed the procedure including the risks, benefits and alternatives for the proposed anesthesia with the patient or authorized representative who has indicated his/her understanding and acceptance.   Dental Advisory Given  Plan Discussed with: CRNA, Anesthesiologist and Surgeon  Anesthesia Plan Comments:         Anesthesia Quick Evaluation

## 2018-04-23 ENCOUNTER — Ambulatory Visit (HOSPITAL_COMMUNITY)
Admission: RE | Admit: 2018-04-23 | Discharge: 2018-04-23 | Disposition: A | Discharge status: Home / Self Care | Payer: Medicare Other | Source: Ambulatory Visit | Attending: Surgery | Admitting: Surgery

## 2018-04-23 ENCOUNTER — Encounter (HOSPITAL_COMMUNITY): Payer: Self-pay | Admitting: *Deleted

## 2018-04-23 ENCOUNTER — Other Ambulatory Visit: Payer: Self-pay

## 2018-04-23 ENCOUNTER — Encounter (HOSPITAL_COMMUNITY)
Admission: RE | Disposition: A | Discharge status: Home / Self Care | Payer: Self-pay | Source: Ambulatory Visit | Attending: Surgery

## 2018-04-23 ENCOUNTER — Ambulatory Visit (HOSPITAL_COMMUNITY): Payer: Medicare Other | Admitting: Anesthesiology

## 2018-04-23 DIAGNOSIS — Z905 Acquired absence of kidney: Secondary | ICD-10-CM | POA: Insufficient documentation

## 2018-04-23 DIAGNOSIS — K4020 Bilateral inguinal hernia, without obstruction or gangrene, not specified as recurrent: Secondary | ICD-10-CM | POA: Diagnosis not present

## 2018-04-23 DIAGNOSIS — K4091 Unilateral inguinal hernia, without obstruction or gangrene, recurrent: Secondary | ICD-10-CM

## 2018-04-23 DIAGNOSIS — Z79899 Other long term (current) drug therapy: Secondary | ICD-10-CM | POA: Insufficient documentation

## 2018-04-23 DIAGNOSIS — R338 Other retention of urine: Secondary | ICD-10-CM | POA: Insufficient documentation

## 2018-04-23 DIAGNOSIS — K4090 Unilateral inguinal hernia, without obstruction or gangrene, not specified as recurrent: Secondary | ICD-10-CM

## 2018-04-23 DIAGNOSIS — C779 Secondary and unspecified malignant neoplasm of lymph node, unspecified: Secondary | ICD-10-CM | POA: Diagnosis not present

## 2018-04-23 DIAGNOSIS — N401 Enlarged prostate with lower urinary tract symptoms: Secondary | ICD-10-CM | POA: Diagnosis not present

## 2018-04-23 DIAGNOSIS — C61 Malignant neoplasm of prostate: Secondary | ICD-10-CM | POA: Insufficient documentation

## 2018-04-23 DIAGNOSIS — K219 Gastro-esophageal reflux disease without esophagitis: Secondary | ICD-10-CM | POA: Insufficient documentation

## 2018-04-23 HISTORY — PX: INGUINAL HERNIA REPAIR: SHX194

## 2018-04-23 HISTORY — PX: INSERTION OF MESH: SHX5868

## 2018-04-23 HISTORY — PX: LAPAROSCOPIC LYSIS OF ADHESIONS: SHX5905

## 2018-04-23 SURGERY — REPAIR, HERNIA, INGUINAL, BILATERAL, LAPAROSCOPIC
Anesthesia: General | Site: Inguinal | Laterality: Bilateral

## 2018-04-23 MED ORDER — OXYCODONE HCL 5 MG PO TABS: 5.0000 mg | ORAL_TABLET | Freq: Once | ORAL | Status: DC | PRN

## 2018-04-23 MED ORDER — ACETAMINOPHEN 325 MG PO TABS: 325.0000 mg | ORAL_TABLET | ORAL | Status: DC | PRN

## 2018-04-23 MED ORDER — DEXAMETHASONE SODIUM PHOSPHATE 10 MG/ML IJ SOLN
INTRAMUSCULAR | Status: AC
  Filled 2018-04-23: qty 1

## 2018-04-23 MED ORDER — KETAMINE HCL 10 MG/ML IJ SOLN
INTRAMUSCULAR | Status: AC
  Filled 2018-04-23: qty 1

## 2018-04-23 MED ORDER — ROCURONIUM BROMIDE 10 MG/ML (PF) SYRINGE
PREFILLED_SYRINGE | INTRAVENOUS | Status: DC | PRN
  Administered 2018-04-23: 10 mg via INTRAVENOUS
  Administered 2018-04-23: 60 mg via INTRAVENOUS

## 2018-04-23 MED ORDER — MIDAZOLAM HCL 2 MG/2ML IJ SOLN
INTRAMUSCULAR | Status: AC
  Filled 2018-04-23: qty 2

## 2018-04-23 MED ORDER — METOPROLOL TARTRATE 5 MG/5ML IV SOLN
INTRAVENOUS | Status: AC
  Filled 2018-04-23: qty 5

## 2018-04-23 MED ORDER — ONDANSETRON HCL 4 MG/2ML IJ SOLN
INTRAMUSCULAR | Status: DC | PRN
  Administered 2018-04-23: 4 mg via INTRAVENOUS

## 2018-04-23 MED ORDER — BUPIVACAINE LIPOSOME 1.3 % IJ SUSP
20.0000 mL | Freq: Once | INTRAMUSCULAR | Status: AC
  Administered 2018-04-23: 20 mL
  Filled 2018-04-23: qty 20

## 2018-04-23 MED ORDER — PHENYLEPHRINE 40 MCG/ML (10ML) SYRINGE FOR IV PUSH (FOR BLOOD PRESSURE SUPPORT)
PREFILLED_SYRINGE | INTRAVENOUS | Status: AC
  Filled 2018-04-23: qty 10

## 2018-04-23 MED ORDER — PROPOFOL 10 MG/ML IV BOLUS
INTRAVENOUS | Status: DC | PRN
  Administered 2018-04-23: 150 mg via INTRAVENOUS

## 2018-04-23 MED ORDER — LACTATED RINGERS IV SOLN
INTRAVENOUS | Status: DC
  Administered 2018-04-23 (×2): via INTRAVENOUS

## 2018-04-23 MED ORDER — EPHEDRINE 5 MG/ML INJ
INTRAVENOUS | Status: AC
  Filled 2018-04-23: qty 10

## 2018-04-23 MED ORDER — CHLORHEXIDINE GLUCONATE CLOTH 2 % EX PADS: 6.0000 | MEDICATED_PAD | Freq: Once | CUTANEOUS | Status: DC

## 2018-04-23 MED ORDER — MEPERIDINE HCL 50 MG/ML IJ SOLN: 6.2500 mg | INTRAMUSCULAR | Status: DC | PRN

## 2018-04-23 MED ORDER — LIDOCAINE HCL 2 % IJ SOLN
INTRAMUSCULAR | Status: AC
  Filled 2018-04-23: qty 20

## 2018-04-23 MED ORDER — FENTANYL CITRATE (PF) 250 MCG/5ML IJ SOLN
INTRAMUSCULAR | Status: DC | PRN
  Administered 2018-04-23: 100 ug via INTRAVENOUS

## 2018-04-23 MED ORDER — KETAMINE HCL 10 MG/ML IJ SOLN
INTRAMUSCULAR | Status: DC | PRN
  Administered 2018-04-23 (×2): 20 mg via INTRAVENOUS

## 2018-04-23 MED ORDER — ACETAMINOPHEN 500 MG PO TABS
1000.0000 mg | ORAL_TABLET | ORAL | Status: AC
  Administered 2018-04-23: 1000 mg via ORAL
  Filled 2018-04-23: qty 2

## 2018-04-23 MED ORDER — MIDAZOLAM HCL 5 MG/5ML IJ SOLN
INTRAMUSCULAR | Status: DC | PRN
  Administered 2018-04-23: 2 mg via INTRAVENOUS

## 2018-04-23 MED ORDER — PHENYLEPHRINE 40 MCG/ML (10ML) SYRINGE FOR IV PUSH (FOR BLOOD PRESSURE SUPPORT)
PREFILLED_SYRINGE | INTRAVENOUS | Status: DC | PRN
  Administered 2018-04-23 (×4): 80 ug via INTRAVENOUS

## 2018-04-23 MED ORDER — ROCURONIUM BROMIDE 100 MG/10ML IV SOLN
INTRAVENOUS | Status: AC
  Filled 2018-04-23: qty 1

## 2018-04-23 MED ORDER — CEFAZOLIN SODIUM-DEXTROSE 2-4 GM/100ML-% IV SOLN
2.0000 g | INTRAVENOUS | Status: AC
  Administered 2018-04-23: 2 g via INTRAVENOUS
  Filled 2018-04-23: qty 100

## 2018-04-23 MED ORDER — FENTANYL CITRATE (PF) 100 MCG/2ML IJ SOLN
25.0000 ug | INTRAMUSCULAR | Status: DC | PRN
  Administered 2018-04-23: 50 ug via INTRAVENOUS

## 2018-04-23 MED ORDER — NAPROXEN 500 MG PO TABS: 500.0000 mg | ORAL_TABLET | Freq: Two times a day (BID) | ORAL | 1 refills | Status: AC

## 2018-04-23 MED ORDER — 0.9 % SODIUM CHLORIDE (POUR BTL) OPTIME
TOPICAL | Status: DC | PRN
  Administered 2018-04-23: 1000 mL

## 2018-04-23 MED ORDER — LACTATED RINGERS IR SOLN
Status: DC | PRN
  Administered 2018-04-23: 1000 mL

## 2018-04-23 MED ORDER — GABAPENTIN 300 MG PO CAPS
300.0000 mg | ORAL_CAPSULE | ORAL | Status: AC
  Administered 2018-04-23: 300 mg via ORAL
  Filled 2018-04-23: qty 1

## 2018-04-23 MED ORDER — LIDOCAINE 2% (20 MG/ML) 5 ML SYRINGE
INTRAMUSCULAR | Status: DC | PRN
  Administered 2018-04-23: 1.5 mg/kg/h via INTRAVENOUS

## 2018-04-23 MED ORDER — BUPIVACAINE-EPINEPHRINE 0.25% -1:200000 IJ SOLN
INTRAMUSCULAR | Status: DC | PRN
  Administered 2018-04-23: 60 mL

## 2018-04-23 MED ORDER — EPHEDRINE SULFATE-NACL 50-0.9 MG/10ML-% IV SOSY
PREFILLED_SYRINGE | INTRAVENOUS | Status: DC | PRN
  Administered 2018-04-23 (×2): 10 mg via INTRAVENOUS
  Administered 2018-04-23: 5 mg via INTRAVENOUS

## 2018-04-23 MED ORDER — FENTANYL CITRATE (PF) 100 MCG/2ML IJ SOLN
INTRAMUSCULAR | Status: AC
  Filled 2018-04-23: qty 2

## 2018-04-23 MED ORDER — GLYCOPYRROLATE PF 0.2 MG/ML IJ SOSY
PREFILLED_SYRINGE | INTRAMUSCULAR | Status: DC | PRN
  Administered 2018-04-23: .1 mg via INTRAVENOUS

## 2018-04-23 MED ORDER — SUGAMMADEX SODIUM 200 MG/2ML IV SOLN
INTRAVENOUS | Status: DC | PRN
  Administered 2018-04-23: 400 mg via INTRAVENOUS

## 2018-04-23 MED ORDER — CELECOXIB 200 MG PO CAPS
ORAL_CAPSULE | ORAL | Status: AC
  Filled 2018-04-23: qty 1

## 2018-04-23 MED ORDER — FENTANYL CITRATE (PF) 100 MCG/2ML IJ SOLN: 25.0000 ug | INTRAMUSCULAR | Status: DC | PRN

## 2018-04-23 MED ORDER — LIDOCAINE 2% (20 MG/ML) 5 ML SYRINGE
INTRAMUSCULAR | Status: AC
  Filled 2018-04-23: qty 5

## 2018-04-23 MED ORDER — STERILE WATER FOR IRRIGATION IR SOLN
Status: DC | PRN
  Administered 2018-04-23: 1000 mL

## 2018-04-23 MED ORDER — DEXAMETHASONE SODIUM PHOSPHATE 10 MG/ML IJ SOLN
INTRAMUSCULAR | Status: DC | PRN
  Administered 2018-04-23: 10 mg via INTRAVENOUS

## 2018-04-23 MED ORDER — ACETAMINOPHEN 160 MG/5ML PO SOLN: 325.0000 mg | ORAL | Status: DC | PRN

## 2018-04-23 MED ORDER — BUPIVACAINE-EPINEPHRINE (PF) 0.25% -1:200000 IJ SOLN
INTRAMUSCULAR | Status: AC
  Filled 2018-04-23: qty 60

## 2018-04-23 MED ORDER — PROPOFOL 10 MG/ML IV BOLUS
INTRAVENOUS | Status: AC
  Filled 2018-04-23: qty 40

## 2018-04-23 MED ORDER — OXYCODONE HCL 5 MG/5ML PO SOLN
5.0000 mg | Freq: Once | ORAL | Status: DC | PRN
  Filled 2018-04-23: qty 5

## 2018-04-23 MED ORDER — ONDANSETRON HCL 4 MG/2ML IJ SOLN: 4.0000 mg | Freq: Once | INTRAMUSCULAR | Status: DC | PRN

## 2018-04-23 MED ORDER — FENTANYL CITRATE (PF) 100 MCG/2ML IJ SOLN
INTRAMUSCULAR | Status: AC
  Administered 2018-04-23: 50 ug via INTRAVENOUS
  Filled 2018-04-23: qty 2

## 2018-04-23 MED ORDER — ONDANSETRON HCL 4 MG/2ML IJ SOLN
INTRAMUSCULAR | Status: AC
  Filled 2018-04-23: qty 2

## 2018-04-23 MED ORDER — OXYCODONE HCL 5 MG PO TABS: 5.0000 mg | ORAL_TABLET | Freq: Four times a day (QID) | ORAL | 0 refills | Status: AC | PRN

## 2018-04-23 MED ORDER — CELECOXIB 200 MG PO CAPS
200.0000 mg | ORAL_CAPSULE | ORAL | Status: AC
  Administered 2018-04-23: 200 mg via ORAL
  Filled 2018-04-23: qty 1

## 2018-04-23 MED ORDER — GLYCOPYRROLATE 0.2 MG/ML IV SOSY
PREFILLED_SYRINGE | INTRAVENOUS | Status: AC
  Filled 2018-04-23: qty 3

## 2018-04-23 MED ORDER — SUGAMMADEX SODIUM 500 MG/5ML IV SOLN
INTRAVENOUS | Status: AC
  Filled 2018-04-23: qty 5

## 2018-04-23 MED ORDER — LIDOCAINE 2% (20 MG/ML) 5 ML SYRINGE
INTRAMUSCULAR | Status: DC | PRN
  Administered 2018-04-23: 60 mg via INTRAVENOUS

## 2018-04-23 SURGICAL SUPPLY — 35 items
CABLE HIGH FREQUENCY MONO STRZ (ELECTRODE) ×4 IMPLANT
CHLORAPREP W/TINT 26ML (MISCELLANEOUS) ×4 IMPLANT
COVER SURGICAL LIGHT HANDLE (MISCELLANEOUS) ×4 IMPLANT
DECANTER SPIKE VIAL GLASS SM (MISCELLANEOUS) ×4 IMPLANT
DEVICE SECURE STRAP 25 ABSORB (INSTRUMENTS) ×4 IMPLANT
DRAPE WARM FLUID 44X44 (DRAPE) ×4 IMPLANT
DRSG TEGADERM 2-3/8X2-3/4 SM (GAUZE/BANDAGES/DRESSINGS) ×4 IMPLANT
DRSG TEGADERM 4X4.75 (GAUZE/BANDAGES/DRESSINGS) ×4 IMPLANT
ELECT REM PT RETURN 15FT ADLT (MISCELLANEOUS) ×4 IMPLANT
GAUZE SPONGE 2X2 8PLY STRL LF (GAUZE/BANDAGES/DRESSINGS) ×2 IMPLANT
GLOVE ECLIPSE 8.0 STRL XLNG CF (GLOVE) ×4 IMPLANT
GLOVE INDICATOR 8.0 STRL GRN (GLOVE) ×4 IMPLANT
GOWN STRL REUS W/TWL XL LVL3 (GOWN DISPOSABLE) ×8 IMPLANT
IRRIG SUCT STRYKERFLOW 2 WTIP (MISCELLANEOUS) ×4
IRRIGATION SUCT STRKRFLW 2 WTP (MISCELLANEOUS) ×2 IMPLANT
KIT BASIN OR (CUSTOM PROCEDURE TRAY) ×4 IMPLANT
MESH HERNIA 6X6 BARD (Mesh General) ×6 IMPLANT
MESH HERNIA BARD 6X6 (Mesh General) ×6 IMPLANT
NEEDLE INSUFFLATION 14GA 120MM (NEEDLE) IMPLANT
PAD POSITIONING PINK XL (MISCELLANEOUS) ×4 IMPLANT
SCISSORS LAP 5X35 DISP (ENDOMECHANICALS) ×4 IMPLANT
SLEEVE ADV FIXATION 5X100MM (TROCAR) ×4 IMPLANT
SPONGE GAUZE 2X2 STER 10/PKG (GAUZE/BANDAGES/DRESSINGS) ×2
SUT MNCRL AB 4-0 PS2 18 (SUTURE) ×4 IMPLANT
SUT PDS AB 1 CT1 27 (SUTURE) ×4 IMPLANT
SUT VIC AB 2-0 SH 27 (SUTURE) ×2
SUT VIC AB 2-0 SH 27X BRD (SUTURE) ×2 IMPLANT
SUT VICRYL 0 UR6 27IN ABS (SUTURE) ×4 IMPLANT
TACKER 5MM HERNIA 3.5CML NAB (ENDOMECHANICALS) IMPLANT
TOWEL OR 17X26 10 PK STRL BLUE (TOWEL DISPOSABLE) ×4 IMPLANT
TOWEL OR NON WOVEN STRL DISP B (DISPOSABLE) ×4 IMPLANT
TRAY LAPAROSCOPIC (CUSTOM PROCEDURE TRAY) ×4 IMPLANT
TROCAR ADV FIXATION 5X100MM (TROCAR) ×4 IMPLANT
TROCAR XCEL BLUNT TIP 100MML (ENDOMECHANICALS) ×4 IMPLANT
TUBING INSUF HEATED (TUBING) ×4 IMPLANT

## 2018-04-23 NOTE — Discharge Instructions (Signed)
HERNIA REPAIR: POST OP INSTRUCTIONS ° °###################################################################### ° °EAT °Gradually transition to a high fiber diet with a fiber supplement over the next few weeks after discharge.  Start with a pureed / full liquid diet (see below) ° °WALK °Walk an hour a day.  Control your pain to do that.   ° °CONTROL PAIN °Control pain so that you can walk, sleep, tolerate sneezing/coughing, and go up/down stairs. ° °HAVE A BOWEL MOVEMENT DAILY °Keep your bowels regular to avoid problems.  OK to try a laxative to override constipation.  OK to use an antidairrheal to slow down diarrhea.  Call if not better after 2 tries ° °CALL IF YOU HAVE PROBLEMS/CONCERNS °Call if you are still struggling despite following these instructions. °Call if you have concerns not answered by these instructions ° °###################################################################### ° ° ° °1. DIET: Follow a light bland diet the first 24 hours after arrival home, such as soup, liquids, crackers, etc.  Be sure to include lots of fluids daily.  Advance to a low fat / high fiber diet over the next few days after surgery.  Avoid fast food or heavy meals the first week as your are more likely to get nauseated.   ° °2. Take your usually prescribed home medications unless otherwise directed. ° °3. PAIN CONTROL: °a. Pain is best controlled by a usual combination of three different methods TOGETHER: °i. Ice/Heat °ii. Over the counter pain medication °iii. Prescription pain medication °b. Most patients will experience some swelling and bruising around the hernia(s) such as the bellybutton, groins, or old incisions.  Ice packs or heating pads (30-60 minutes up to 6 times a day) will help. Use ice for the first few days to help decrease swelling and bruising, then switch to heat to help relax tight/sore spots and speed recovery.  Some people prefer to use ice alone, heat alone, alternating between ice & heat.  Experiment  to what works for you.  Swelling and bruising can take several weeks to resolve.   °c. It is helpful to take an over-the-counter pain medication regularly for the first few weeks.  Choose one of the following that works best for you: °i. Naproxen (Aleve, etc)  Two 220mg tabs twice a day °ii. Ibuprofen (Advil, etc) Three 200mg tabs four times a day (every meal & bedtime) °iii. Acetaminophen (Tylenol, etc) 325-650mg four times a day (every meal & bedtime) °d. A  prescription for pain medication should be given to you upon discharge.  Take your pain medication as prescribed.  °i. If you are having problems/concerns with the prescription medicine (does not control pain, nausea, vomiting, rash, itching, etc), please call us [phone] to see if we need to switch you to a different pain medicine that will work better for you and/or control your side effect better. °ii. If you need a refill on your pain medication, please contact your pharmacy.  They will contact our office to request authorization. Prescriptions will not be filled after 5 pm or on week-ends. ° °4. Avoid getting constipated.  Between the surgery and the pain medications, it is common to experience some constipation.  Increasing fluid intake and taking a fiber supplement (such as Metamucil, Citrucel, FiberCon, MiraLax, etc) 1-2 times a day regularly will usually help prevent this problem from occurring.  A mild laxative (prune juice, Milk of Magnesia, MiraLax, etc) should be taken according to package directions if there are no bowel movements after 48 hours.   ° °5. Wash / shower every   day.  You may shower over the dressings as they are waterproof.    6. Remove your waterproof bandages 5 days after surgery.  You may leave the incisions open to air.  You may replace a dressing/Band-Aid to cover an incision for comfort if you wish.  Continue to shower over incision(s) after the dressing is off.  7. ACTIVITIES as tolerated:   a. You may resume  regular (light) daily activities beginning the next day--such as daily self-care, walking, climbing stairs--gradually increasing activities as tolerated.  If you can walk 30 minutes without difficulty, it is safe to try more intense activity such as jogging, treadmill, bicycling, low-impact aerobics, swimming, etc. b. Save the most intensive and strenuous activity for last such as sit-ups, heavy lifting, contact sports, etc  Refrain from any heavy lifting or straining until you are off narcotics for pain control.   c. DO NOT PUSH THROUGH PAIN.  Let pain be your guide: If it hurts to do something, don't do it.  Pain is your body warning you to avoid that activity for another week until the pain goes down. d. You may drive when you are no longer taking prescription pain medication, you can comfortably wear a seatbelt, and you can safely maneuver your car and apply brakes. e. Dennis Bast may have sexual intercourse when it is comfortable.   8. FOLLOW UP in our office a. Please call [facility] at [phone] to set up an appointment to see your surgeon in the office for a follow-up appointment approximately 2-3 weeks after your surgery. b. Make sure that you call for this appointment the day you arrive home to insure a convenient appointment time.  9.  If you have disability of FMLA / Family leave forms, please bring the forms to the office for processing.  (do not give to your surgeon).  WHEN TO CALL us [phone]: 1. Poor pain control 2. Reactions / problems with new medications (rash/itching, nausea, etc)  3. Fever over 101.5 F (38.5 C) 4. Inability to urinate 5. Nausea and/or vomiting 6. Worsening swelling or bruising 7. Continued bleeding from incision. 8. Increased pain, redness, or drainage from the incision   The clinic staff is available to answer your questions during regular business hours (8:30am-5pm).  Please dont hesitate to call and ask to speak to one of our nurses for clinical concerns.    If you have a medical emergency, go to the nearest emergency room or call 911.  A surgeon from [facility] is always on call at the hospitals in [facility], [place], [place], [place], [place]  [address] ?  [address] [address], [place], [place]   [address] MAIN: [phone] ? TOLL FREE: [phone] ? FAX: [phone] [email or web address]   Inguinal Hernia, Adult An inguinal hernia is when fat or the intestines push through the area where the leg meets the lower abdomen (groin) and create a rounded lump (bulge). This condition develops over time. There are three types of inguinal hernias. These types include:  Hernias that can be pushed back into the belly (are reducible).  Hernias that are not reducible (are incarcerated).  Hernias that are not reducible and lose their blood supply (are strangulated). This type of hernia requires emergency surgery.  What are the causes? This condition is caused by having a weak spot in the muscles or tissue. This weakness lets the hernia poke through. This condition can be triggered by:  Suddenly straining the  muscles of the lower abdomen.  Lifting heavy objects.  Straining to have a bowel movement. Difficult bowel movements (constipation) can lead to this.  Coughing.  What increases the risk? This condition is more likely to develop in:  Men.  Pregnant women.  People who: ? Are overweight. ? Work in jobs that require long periods of standing or heavy lifting. ? Have had an inguinal hernia before. ? Smoke or have lung disease. These factors can lead to long-lasting (chronic) coughing.  What are the signs or symptoms? Symptoms can depend on the size of the hernia. Often, a small inguinal hernia has no symptoms. Symptoms of a larger hernia include:  A lump in the groin. This is easier to see when the person is standing. It might not be visible when he or she is lying  down.  Pain or burning in the groin. This occurs especially when lifting, straining, or coughing.  A dull ache or a feeling of pressure in the groin.  A lump in the scrotum in men.  Symptoms of a strangulated inguinal hernia can include:  A bulge in the groin that is very painful and tender to the touch.  A bulge that turns red or purple.  Fever, nausea, and vomiting.  The inability to have a bowel movement or to pass gas.  How is this diagnosed? This condition is diagnosed with a medical history and physical exam. Your health care provider may feel your groin area and ask you to cough. How is this treated? Treatment for this condition varies depending on the size of your hernia and whether you have symptoms. If you do not have symptoms, your health care provider may have you watch your hernia carefully and come in for follow-up visits. If your hernia is larger or if you have symptoms, your treatment will include surgery. Follow these instructions at home: Lifestyle  Drink enough fluid to keep your urine clear or pale yellow.  Eat a diet that includes a lot of fiber. Eat plenty of fruits, vegetables, and whole grains. Talk with your health care provider if you have questions.  Avoid lifting heavy objects.  Avoid standing for long periods of time.  Do not use tobacco products, including cigarettes, chewing tobacco, or e-cigarettes. If you need help quitting, ask your health care provider.  Maintain a healthy weight. General instructions  Do not try to force the hernia back in.  Watch your hernia for any changes in color or size. Let your health care provider know if any changes occur.  Take over-the-counter and prescription medicines only as told by your health care provider.  Keep all follow-up visits as told by your health care provider. This is important. Contact a health care provider if:  You have a fever.  You have new symptoms.  Your symptoms get worse. Get  help right away if:  You have pain in the groin that suddenly gets worse.  A bulge in the groin gets bigger suddenly and does not go down.  You are a man and you have a sudden pain in the scrotum, or the size of your scrotum suddenly changes.  A bulge in the groin area becomes red or purple and is painful to the touch.  You have nausea or vomiting that does not go away.  You feel your heart beating a lot more quickly than normal.  You cannot have a bowel movement or pass gas. This information is not intended to replace advice given to you by  your health care provider. Make sure you discuss any questions you have with your health care provider. Document Released: 03/09/2009 Document Revised: 03/28/2016 Document Reviewed: 08/31/2014 Elsevier Interactive Patient Education  2018 [employer].

## 2018-04-23 NOTE — Op Note (Signed)
04/23/2018  9:45 AM  PATIENT:  Phillip Brown  67 y.o. male  Patient Care Team: System, Pcp Not In as PCP - Huston Foley, MD as Consulting Physician (General Surgery) Lucas Mallow, MD as Consulting Physician (Urology)  PRE-OPERATIVE DIAGNOSIS:   Recurrent left inguinal hernia Right inguinal hernia  POST-OPERATIVE DIAGNOSIS:  Recurrent left inguinal hernia Right inguinal hernia   PROCEDURE:   LAPAROSCOPIC BILATERAL INGUINAL HERNIA REPAIRS  INSERTION OF MESH LAPAROSCOPIC LYSIS OF ADHESIONS x 45 min (1/2 case) BILATERAL TAP BLOCK  SURGEON:  Adin Hector, MD  ASSISTANT: RNFA  ANESTHESIA:     Regional ilioinguinal and genitofemoral and spermatic cord nerve blocks  BILATERAL TAP BLOCK (0.25% bupivicaine w epi & Experel) General  EBL:  Total I/O In: -  Out: 50 [Blood:50].  See anesthesia record  Delay start of Pharmacological VTE agent (>24hrs) due to surgical blood loss or risk of bleeding:  no  DRAINS: NONE  SPECIMEN:  NONE  DISPOSITION OF SPECIMEN:  N/A  COUNTS:  YES  PLAN OF CARE: Discharge to home after PACU  PATIENT DISPOSITION:  PACU - hemodynamically stable.  INDICATION: Pleasant gentleman with history of left inguinal hernia repaired in open fashion decades ago.  Recently diagnosed with prostate cancer and urinary retention.  Being managed by Dr. Gloriann Loan.  Undergoing hormonal therapy.  Found to have part of his bladder going into a very large recurrent left inguinal hernia.  Symptomatic right inguinal hernia as well.  I offered laparoscopic possible open exploration and repair of hernias found.  The anatomy & physiology of the abdominal wall and pelvic floor was discussed.  The pathophysiology of hernias in the inguinal and pelvic region was discussed.  Natural history risks such as progressive enlargement, pain, incarceration & strangulation was discussed.   Contributors to complications such as smoking, obesity, diabetes, prior surgery, etc  were discussed.    I feel the risks of no intervention will lead to serious problems that outweigh the operative risks; therefore, I recommended surgery to reduce and repair the hernia.  I explained laparoscopic techniques with possible need for an open approach.  I noted usual use of mesh to patch and/or buttress hernia repair  Risks such as bleeding, infection, abscess, need for further treatment, heart attack, death, and other risks were discussed.  I noted a good likelihood this will help address the problem.   Goals of post-operative recovery were discussed as well.  Possibility that this will not correct all symptoms was explained.  I stressed the importance of low-impact activity, aggressive pain control, avoiding constipation, & not pushing through pain to minimize risk of post-operative chronic pain or injury. Possibility of reherniation was discussed.  We will work to minimize complications.     An educational handout further explaining the pathology & treatment options was given as well.  Questions were answered.  The patient expresses understanding & wishes to proceed with surgery.  OR FINDINGS: On the left side evidence of a prior indirect hernia repair with no major recurrence there but large direct space hernia containing the left dome of the bladder.  Reducible.  Some rocky lymphadenopathy along the left iliac vessels left in situ.  On the right side, moderate size direct space and indirect pantaloon type inguinal hernia.  No femoral hernia.  Laxities at bilateral obturator foramina but doubt a true hernia.  DESCRIPTION:  The patient was identified & brought into the operating room. The patient was positioned supine with arms tucked. SCDs  were active during the entire case. The patient underwent general anesthesia without any difficulty.  The abdomen was prepped and draped in a sterile fashion. The patient's bladder was emptied.  A Surgical Timeout confirmed our plan.  I made a  transverse incision through the inferior umbilical fold.  I made a small transverse nick through the anterior rectus fascia contralateral to the inguinal hernia side and placed a 0-vicryl stitch through the fascia.  I placed a Hasson trocar into the preperitoneal plane.  Entry was clean.  We induced carbon dioxide insufflation. Camera inspection revealed no injury.  I used a 29mm angled scope to bluntly free the peritoneum off the infraumbilical anterior abdominal wall.  I created enough of a preperitoneal pocket to place 21mm ports into the right & left mid-abdomen into this preperitoneal cavity.  I had used some sharp scissor dissection given his prior midline incision as well as prior left inguinal hernia repair.  I focused attention on the LEFT pelvis since that was the dominant hernia side.   I used blunt & focused sharp dissection to free the peritoneum off the flank and down to the pubic rim.  I freed the anteriolateral bladder wall off the anteriolateral pelvic wall, sparing midline attachments.   I located a large swath of peritoneum going into a hernia fascial defect at the  direct space consistent with a direct space inguinal hernia.  While smaller than the left side, it was still moderately large.  I gradually freed the peritoneal hernia sac off safely and reduced it into the preperitoneal space.  This included the left dome of the bladder as anticipated by prior scan.  He had some thickening and scarring at his internal ring consistent with a prior inguinal hernia repair.  No major recurrence there.  I freed the peritoneum off the  spermatic vessels & vas deferens.  I freed peritoneum off the retroperitoneum along the psoas muscle.  He had some rocky lymphadenopathy along the iliac vessels I left in situ avoid any bleeding issues.  Spermatic cord lipoma was dissected away & removed.  I checked & assured hemostasis.     I turned attention on the opposite  RIGHT pelvis.  I did dissection in a similar,  mirror-image fashion. The patient had a direct space inguinal hernia.   Spermatic cord lipoma was dissected away & removed.    I checked & assured hemostasis.   As anticipated, surgical dissection was challenged by dense adhesions and poor planes resulting in the need for careful repair of the resulting midline peritoneal defects.  Repair was done with 2-0 vicryl minimally invasive intracorporeal suturing using absorbable suture.  Because these were large hernias and evidence of recurrence, I decided to use 15x15 cm sheets of medium weight polypropylene mesh (Bard), one for each side.  I cut a single sigmoid-shaped slit ~6cm from a corner of each mesh.  I placed the meshes into the preperitoneal space & laid them as overlapping diamonds such that at the inferior points, a 6x6 cm corner flap rested in the true anterolateral pelvis, covering the obturator & femoral foramina.   I allowed the bladder to return to the pubis, this helping tuck the corners of the mesh in the anteriolateral pelvis.  The medial corners overlapped each other across midline cephalad to the pubic rim.   Given the numerous hernias of moderate size, I placed a third 15x15cm mesh in the center as a vertical diamond.  The lateral wings of the mesh overlap across  the direct spaces and internal rings where the dominant hernias were.  This provided good coverage and reinforcement of the hernia repairs.  Because of the central mesh placement with good overlap, I did not place any tacks.   I held the hernia sacs cephalad & evacuated carbon dioxide.  I closed the fascia with absorbable suture.  I closed the skin using 4-0 monocryl stitch.  Sterile dressings were applied.   The patient was extubated & arrived in the PACU in stable condition..  I had discussed postoperative care with the patient in the holding area.  Instructions are written in the chart.  I discussed operative findings, updated the patient's status, discussed probable steps to  recovery, and gave postoperative recommendations to the patient's family.  Recommendations were made.  Questions were answered.  She expressed understanding & appreciation.   Adin Hector, M.D., F.A.C.S. Gastrointestinal and Minimally Invasive Surgery [facility] [address], [place], [place] [address] [phone] Main / Paging  04/23/2018 9:45 AM

## 2018-04-23 NOTE — H&P (Signed)
Phillip Brown DOB: 1951-04-03  Patient Care Team: System, Pcp Not In as PCP - Huston Foley, MD as Consulting Physician (General Surgery) Lucas Mallow, MD as Consulting Physician (Urology)   ` ` Patient sent for surgical consultation at the request of Dr. Link Snuffer, [facility]  Chief Complaint: Recurrent inguinal hernias in the setting of urinary retention and prostate cancer  The patient is a pleasant gentleman who tends to avoid doctors. Had worsening urinary difficulties. Got to the point he had to be catheterized in the emergency room. This started in early January. Had follow-up with urology. Workup showed evidence of prostate cancer. Some nodal metastasis. Felt not to be a surgical candidate for prostatectomy. On hormonal suppression therapy for now.  Not able to get the catheter out. Patient had history of left inguinal hernia repair by Burgess Amor in the 1980s and an open fashion. Noted a lump in his right side consistent with a right inguinal hernia. That hernia has gradually gotten larger but really didn't notice much in the different from either side. Did not really seem to bother him. However with his prostate cancer workup it was noted that a significant part of the dome of his bladder was herniating into his left side concerning for a recurrent left inguinal hernia as well. Surgical consultation recommended to conceive hernia repair skin help reposition his bladder and help get his catheter out.  He comes today with his wife. Apparently he had an atrophic right kidney that was excised when he was 67 years old. Incidental appendectomy done. That was the 1950s. No other abdominal surgery. The usually do 20 minute walks pretty regularly. He moves his bowels at least once if not twice a day. He's never had a colonoscopy. Tends not to see doctors unless an urgent setting. He does not smoke. He is not diabetic. No cardiopulmonary issues  or strokes that he knows of.   (Review of systems as stated in this history (HPI) or in the review of systems. Otherwise all other 12 point ROS are negative) ` ` `   Past Surgical History Levonne Spiller, CMA; 02/03/2018 8:56 AM) Appendectomy Nephrectomy Left. Open Inguinal Hernia Surgery Left. Oral Surgery  Diagnostic Studies History Levonne Spiller, CMA; 02/03/2018 8:56 AM) Colonoscopy never  Allergies Andee Poles Gerrigner, CMA; 02/03/2018 8:56 AM) No Known Allergies [02/03/2018]: Allergies Reconciled  Medication History Levonne Spiller, CMA; 02/03/2018 8:57 AM) Multiple Vitamin (Oral) Active. Medications Reconciled  Social History Andee Poles Education officer, museum, CMA; 02/03/2018 8:56 AM) Alcohol use Occasional alcohol use. Caffeine use Coffee. No drug use Tobacco use Never smoker.  Family History Levonne Spiller, [place]; 02/03/2018 8:56 AM) Colon Cancer Mother. Hypertension Mother. Melanoma Father. Thyroid problems Daughter.  Other Problems Levonne Spiller, CMA; 02/03/2018 8:56 AM) Bladder Problems Enlarged Prostate Inguinal Hernia Prostate Cancer     Review of Systems Andee Poles Gerrigner CMA; 02/03/2018 8:56 AM) General Present- Fatigue and Night Sweats. Not Present- Appetite Loss, Chills, Fever, Weight Gain and Weight Loss. Skin Not Present- Change in Wart/Mole, Dryness, Hives, Jaundice, New Lesions, Non-Healing Wounds, Rash and Ulcer. HEENT Present- Wears glasses/contact lenses. Not Present- Earache, Hearing Loss, Hoarseness, Nose Bleed, Oral Ulcers, Ringing in the Ears, Seasonal Allergies, Sinus Pain, Sore Throat, Visual Disturbances and Yellow Eyes. Respiratory Present- Snoring. Not Present- Bloody sputum, Chronic Cough, Difficulty Breathing and Wheezing. Breast Not Present- Breast Mass, Breast Pain, Nipple Discharge and Skin Changes. Cardiovascular Not Present- Chest Pain, Difficulty Breathing Lying Down, Leg Cramps, Palpitations,  Rapid Heart Rate, Shortness of Breath  and Swelling of Extremities. Gastrointestinal Not Present- Abdominal Pain, Bloating, Bloody Stool, Change in Bowel Habits, Chronic diarrhea, Constipation, Difficulty Swallowing, Excessive gas, Gets full quickly at meals, Hemorrhoids, Indigestion, Nausea, Rectal Pain and Vomiting. Male Genitourinary Not Present- Blood in Urine, Change in Urinary Stream, Frequency, Impotence, Nocturia, Painful Urination, Urgency and Urine Leakage.  Vitals (Danielle Gerrigner CMA; 02/03/2018 8:57 AM) 02/03/2018 8:57 AM Weight: 165 lb Height: 71in Body Surface Area: 1.94 m Body Mass Index: 23.01 kg/m  Temp.: 97.72F(Oral)  Pulse: 84 (Regular)  BP: 136/80 (Sitting, Left Arm, Standard)   BP (!) 162/87   Pulse 79   Temp 98 F (36.7 C) (Oral)   Resp 18   Ht 5\' 10"  (1.778 m)   Wt 75.3 kg (166 lb)   SpO2 99%   BMI 23.82 kg/m     Physical Exam Adin Hector MD; 02/03/2018 9:32 AM)  General Mental Status-Alert. General Appearance-Not in acute distress, Not Sickly. Orientation-Oriented X3. Hydration-Well hydrated. Voice-Normal.  Integumentary Global Assessment Upon inspection and palpation of skin surfaces of the - Axillae: non-tender, no inflammation or ulceration, no drainage. and Distribution of scalp and body hair is normal. General Characteristics Temperature - normal warmth is noted.  Head and Neck Head-normocephalic, atraumatic with no lesions or palpable masses. Face Global Assessment - atraumatic, no absence of expression. Neck Global Assessment - no abnormal movements, no bruit auscultated on the right, no bruit auscultated on the left, no decreased range of motion, non-tender. Trachea-midline. Thyroid Gland Characteristics - non-tender.  Eye Eyeball - Left-Extraocular movements intact, No Nystagmus. Eyeball - Right-Extraocular movements intact, No Nystagmus. Cornea - Left-No Hazy. Cornea - Right-No  Hazy. Sclera/Conjunctiva - Left-No scleral icterus, No Discharge. Sclera/Conjunctiva - Right-No scleral icterus, No Discharge. Pupil - Left-Direct reaction to light normal. Pupil - Right-Direct reaction to light normal. Note: Wears glasses. Vision corrected  ENMT Ears Pinna - Left - no drainage observed, no generalized tenderness observed. Right - no drainage observed, no generalized tenderness observed. Nose and Sinuses External Inspection of the Nose - no destructive lesion observed. Inspection of the nares - Left - quiet respiration. Right - quiet respiration. Mouth and Throat Lips - Upper Lip - no fissures observed, no pallor noted. Lower Lip - no fissures observed, no pallor noted. Nasopharynx - no discharge present. Oral Cavity/Oropharynx - Tongue - no dryness observed. Oral Mucosa - no cyanosis observed. Hypopharynx - no evidence of airway distress observed.  Chest and Lung Exam Inspection Movements - Normal and Symmetrical. Accessory muscles - No use of accessory muscles in breathing. Palpation Palpation of the chest reveals - Non-tender. Auscultation Breath sounds - Normal and Clear.  Cardiovascular Auscultation Rhythm - Regular. Murmurs & Other Heart Sounds - Auscultation of the heart reveals - No Murmurs and No Systolic Clicks.  Abdomen Inspection Inspection of the abdomen reveals - No Visible peristalsis and No Abnormal pulsations. Umbilicus - No Bleeding, No Urine drainage. Palpation/Percussion Palpation and Percussion of the abdomen reveal - Soft, Non Tender, No Rebound tenderness, No Rigidity (guarding) and No Cutaneous hyperesthesia. Note: Abdomen soft. Nontender. Not distended. No umbilical or incisional hernias. Mild diastases recti. Low midline incision without any hernia. No guarding.  Male Genitourinary Sexual Maturity Tanner 5 - Adult hair pattern and Adult penile size and shape. Note: Bilateral large inguinal hernias to groins but not  down to scrotum. Sensitive but reducible.  Catheter in place. Normal phallus. No testicular masses.  Peripheral Vascular Upper Extremity Inspection - Left - No Cyanotic nailbeds, Not Ischemic. Right -  No Cyanotic nailbeds, Not Ischemic.  Neurologic Neurologic evaluation reveals -normal attention span and ability to concentrate, able to name objects and repeat phrases. Appropriate fund of knowledge , normal sensation and normal coordination. Mental Status Affect - not angry, not paranoid. Cranial Nerves-Normal Bilaterally. Gait-Normal.  Neuropsychiatric Mental status exam performed with findings of-able to articulate well with normal speech/language, rate, volume and coherence, thought content normal with ability to perform basic computations and apply abstract reasoning and no evidence of hallucinations, delusions, obsessions or homicidal/suicidal ideation.  Musculoskeletal Global Assessment Spine, Ribs and Pelvis - no instability, subluxation or laxity. Right Upper Extremity - no instability, subluxation or laxity.  Lymphatic Head & Neck  General Head & Neck Lymphatics: Bilateral - Description - No Localized lymphadenopathy. Axillary  General Axillary Region: Bilateral - Description - No Localized lymphadenopathy. Femoral & Inguinal  Generalized Femoral & Inguinal Lymphatics: Left - Description - No Localized lymphadenopathy. Right - Description - No Localized lymphadenopathy.    Assessment & Plan Adin Hector MD; 02/03/2018 9:37 AM)  BILATERAL RECURRENT INGUINAL HERNIA WITHOUT OBSTRUCTION OR GANGRENE (K40.21) Impression: Moderate large inguinal hernias to groins. Right side containing omental fat. Left side containing probably half of the bladder. I think he would benefit from repair. We do laparoscopic underlay repair with mesh. Most likely 3 sheets. Most likely normal density Bard given the larger defects. The patient's how large the defects are seen  laparoscopically. These are recurrent.  I called discussed with the patient's urologist, Dr. Link Snuffer. Patient has metastatic prostate cancer. The plan is palliative hormonal therapy. Perhaps radiation in the future but not now. Highly unlikely that he is a candidate for prostatectomy since he has node positive disease.  The hope is once the hernias are reduced and the bladder as a normal positioning and the prostate cancers treated, his ability urinate will improve and the catheter can be removed. I will defer to Dr. Gloriann Loan of that.  Patient's daughter is getting married in about 6 weeks. I offered to see if we could repair this in the next couple weeks give him a month to recover. He is leaning more towards waiting until after the wedding since his wife is very busy working as well. I think he can wait as long as he does not have worsening symptoms. Unlikely that the hernias will become strangulated with only omentum and bladder in them over the next few months. We will see.  The anatomy & physiology of the abdominal wall and pelvic floor was discussed. The pathophysiology of hernias in the inguinal and pelvic region was discussed. Natural history risks such as progressive enlargement, pain, incarceration, and strangulation was discussed. Contributors to complications such as smoking, obesity, diabetes, prior surgery, etc were discussed.  I feel the risks of no intervention will lead to serious problems that outweigh the operative risks; therefore, I recommended surgery to reduce and repair the hernia. I explained laparoscopic techniques with possible need for an open approach. I noted usual use of mesh to patch and/or buttress hernia repair  Risks such as bleeding, infection, abscess, need for further treatment, heart attack, death, and other risks were discussed. I noted a good likelihood this will help address the problem. Goals of post-operative recovery were discussed as well.  Possibility that this will not correct all symptoms was explained. I stressed the importance of low-impact activity, aggressive pain control, avoiding constipation, & not pushing through pain to minimize risk of post-operative chronic pain or injury. Possibility of reherniation was discussed.  We will work to minimize complications.  An educational handout further explaining the pathology & treatment options was given as well. Questions were answered. The patient expresses understanding & wishes to proceed with surgery.  Pt Education - Pamphlet Given - Laparoscopic Hernia Repair: discussed with patient and provided information. Pt Education - [facility] Pain Control (Anijah Spohr) Pt Education - [facility] Hernia Post-Op HCI (Lerry Cordrey): discussed with patient and provided information. Pt Education - [facility] Mesh education: discussed with patient and provided information.  HIATAL HERNIA WITH GERD (K21.9) Impression: Moderate size hiatal hernia. Only some mild heartburn usually controlled with over-the-counter medications.  Would not try to do anything more aggressive fix at this time. Certainly prostate cancer and inguinal hernias and Foley issues are the higher priority. He consider seeing gastroenterology to evaluate that in addition to recommended screening colonoscopy since she sages over 50 any hernias 1 cancer. He needs to get established with a regular primary care physician.   DIASTASIS RECTI (M62.08)  Current Plans Pt Education - [facility]CS Diastasis Recti: discussed with patient and provided information  Adin Hector, MD, FACS, MASCRS Gastrointestinal and Minimally Invasive Surgery    [address], [place], [place] [address] [phone] Main / Paging [phone] Fax

## 2018-04-23 NOTE — Interval H&P Note (Signed)
History and Physical Interval Note:  04/23/2018 7:14 AM  Phillip Brown  has presented today for surgery, with the diagnosis of bilateral inguinal recurrent hernias  The various methods of treatment have been discussed with the patient and family. After consideration of risks, benefits and other options for treatment, the patient has consented to  Procedure(s): [place] (Bilateral) INSERTION OF MESH (Bilateral) as a surgical intervention .  The patient's history has been reviewed, patient examined, no change in status, stable for surgery.   I have re-reviewed the the patient's records, history, medications, and allergies.  I have re-examined the patient.  I again discussed intraoperative plans and goals of post-operative recovery.  The patient agrees to proceed.  Phillip Brown  July 26, 1951 [id number]  Patient Care Team: System, Pcp Not In as PCP - Huston Foley, MD as Consulting Physician (General Surgery) Lucas Mallow, MD as Consulting Physician (Urology)  There are no active problems to display for this patient.   Past Medical History:  Diagnosis Date  . Bilateral inguinal hernia   . BPH (benign prostatic hyperplasia)   . Prostate cancer [facility])    being managed by Dr Link Snuffer with [facility]   . Urinary catheter in place     Past Surgical History:  Procedure Laterality Date  . APPENDECTOMY     age 66   . HERNIA REPAIR  1980   Inguinal; Dr Burgess Amor   . 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 Marital status: Married    Spouse name: Not on file  . Number of children: Not on file  . Years of education: Not on file  . Highest education level: Not on file  Occupational History  . Not on file  Social Needs  . Financial resource strain: Not on file  . Food insecurity:    Worry: Not  on file    Inability: Not on file  . Transportation needs:    Medical: Not on file    Non-medical: Not on file  Tobacco Use  . Smoking status: Never Smoker  . Smokeless tobacco: Never Used  Substance and Sexual Activity  . Alcohol use: Yes    Comment: occasionally   . Drug use: No  . Sexual activity: Not on file  Lifestyle  . Physical activity:    Days per week: Not on file    Minutes per session: Not on file  . Stress: Not on file  Relationships  . Social connections:    Talks on phone: Not on file    Gets together: Not on file    Attends religious service: Not on file    Active member of club or organization: Not on file    Attends meetings of clubs or organizations: Not on file    Relationship status: Not on file  . Intimate partner violence:    Fear of current or ex partner: Not on file    Emotionally abused: Not on file    Physically abused: Not on file    Forced sexual activity: Not on file  Other Topics Concern  . Not on file  Social History Narrative  . Not on file    History reviewed. No pertinent family history.  Medications Prior to Admission  Medication Sig Dispense Refill Last Dose  . acetaminophen (TYLENOL)  500 MG tablet Take 500-1,000 mg by mouth every 6 (six) hours as needed (for pain or headaches).   PRN at PRN  . BLACK PEPPER-TURMERIC PO Take 1 tablet by mouth daily.   Past Week at Unknown time  . Multiple Vitamin (MULTIVITAMIN WITH MINERALS) TABS tablet Take 2 tablets by mouth daily.   04/22/2018 at Unknown time  . Multiple Vitamin (MULTIVITAMIN) tablet Take 2 tablets by mouth 2 (two) times daily. "bone up" calcium enriched multivitamin   04/22/2018 at Unknown time  . Multiple Vitamins-Minerals (PRESERVISION AREDS) CAPS Take 1 capsule by mouth daily.   04/22/2018 at Unknown time  . OVER THE COUNTER MEDICATION Take 1 tablet by mouth daily as needed (energy). "blood builder" iron complex     . Tryptophan 500 MG TABS Take 500 mg by mouth at bedtime as  needed (sleep).   Past Month at Unknown time  . Leuprolide Acetate (LUPRON IJ) Inject 1 Dose as directed every 6 (six) months.   More than a month at Unknown time  . tamsulosin (FLOMAX) 0.4 MG CAPS capsule Take 1 capsule (0.4 mg total) by mouth daily. (Patient not taking: Reported on 04/08/2018) 30 capsule 0 Not Taking at Unknown time    Current Facility-Administered Medications  Medication Dose Route Frequency Provider Last Rate Last Dose  . ceFAZolin (ANCEF) IVPB 2g/100 mL premix  2 g Intravenous On Call to OR Michael Boston, MD      . celecoxib (CELEBREX) 200 MG capsule           . Chlorhexidine Gluconate Cloth 2 % PADS 6 each  6 each Topical Once Michael Boston, MD       And  . Chlorhexidine Gluconate Cloth 2 % PADS 6 each  6 each Topical Once Michael Boston, MD      . gentamicin (GARAMYCIN) 380 mg in dextrose 5 % 100 mL IVPB  5 mg/kg Intravenous On Call to [place], Haze Justin, RPH      . lactated ringers infusion   Intravenous Continuous Albertha Ghee, MD 20 mL/hr at 04/23/18 [phone]       No Known Allergies  BP (!) 162/87   Brown 79   Temp 98 F (36.7 C) (Oral)   Resp 18   Ht 5\' 10"  (1.778 m)   Wt 75.3 kg (166 lb)   SpO2 99%   BMI 23.82 kg/m   Labs: No results found for this or any previous visit (from the past 48 hour(s)).  Imaging / Studies: No results found.   Phillip Brown, M.D., F.A.C.S. Gastrointestinal and Minimally Invasive Surgery [facility] [address], [place], [place] [address] [phone] Main / Paging  04/23/2018 7:15 AM    I have reviewed the patient's chart and labs.  Questions were answered to the patient's satisfaction.     Phillip Brown

## 2018-04-23 NOTE — Addendum Note (Signed)
Addendum  created 04/23/18 1336 by Mitzie Na, CRNA   Intraprocedure Flowsheets edited

## 2018-04-23 NOTE — Anesthesia Procedure Notes (Signed)
Procedure Name: Intubation Date/Time: 04/23/2018 7:38 AM Performed by: Mitzie Na, CRNA Pre-anesthesia Checklist: Patient identified, Emergency Drugs available, Suction available, Patient being monitored and Timeout performed Patient Re-evaluated:Patient Re-evaluated prior to induction Oxygen Delivery Method: Circle system utilized Preoxygenation: Pre-oxygenation with 100% oxygen Induction Type: IV induction Ventilation: Mask ventilation without difficulty and Oral airway inserted - appropriate to patient size Laryngoscope Size: Mac and 3 Grade View: Grade I Tube type: Oral Tube size: 7.5 mm Number of attempts: 1 Airway Equipment and Method: Stylet Placement Confirmation: ETT inserted through vocal cords under direct vision,  positive ETCO2 and breath sounds checked- equal and bilateral Secured at: 21 cm Tube secured with: Tape Dental Injury: Teeth and Oropharynx as per pre-operative assessment

## 2018-04-23 NOTE — Anesthesia Postprocedure Evaluation (Signed)
Anesthesia Post Note  Patient: Phillip Brown  Procedure(s) Performed: LAPAROSCOPIC BILATERAL INGUINAL HERNIA REPAIRS ERAS PATHWAY BILATERAL TAP BLOCK (Bilateral Inguinal) INSERTION OF MESH (Bilateral Inguinal) LAPAROSCOPIC LYSIS OF ADHESIONS (Abdomen)     Patient location during evaluation: PACU Anesthesia Type: General Level of consciousness: awake and alert Pain management: pain level controlled Vital Signs Assessment: post-procedure vital signs reviewed and stable Respiratory status: spontaneous breathing, nonlabored ventilation, respiratory function stable and patient connected to nasal cannula oxygen Cardiovascular status: blood pressure returned to baseline and stable Postop Assessment: no apparent nausea or vomiting Anesthetic complications: no    Last Vitals:  Vitals:   04/23/18 1015 04/23/18 1030  BP: 139/80 134/73  Pulse: 75 72  Resp: 12 12  Temp:  36.4 C  SpO2: 100% 97%    Last Pain:  Vitals:   04/23/18 1030  TempSrc:   PainSc: 0-No pain                 Marylou Wages

## 2018-04-23 NOTE — Transfer of Care (Signed)
Immediate Anesthesia Transfer of Care Note  Patient: Phillip Brown  Procedure(s) Performed: LAPAROSCOPIC BILATERAL INGUINAL HERNIA REPAIRS ERAS PATHWAY BILATERAL TAP BLOCK (Bilateral Inguinal) INSERTION OF MESH (Bilateral Inguinal) LAPAROSCOPIC LYSIS OF ADHESIONS (Abdomen)  Patient Location: PACU  Anesthesia Type:General  Level of Consciousness: drowsy and patient cooperative  Airway & Oxygen Therapy: Patient Spontanous Breathing and Patient connected to face mask oxygen  Post-op Assessment: Report given to RN, Post -op Vital signs reviewed and stable and Patient moving all extremities  Post vital signs: Reviewed and stable  Last Vitals:  Vitals Value Taken Time  BP 157/82 04/23/2018  9:54 AM  Temp    Pulse 82 04/23/2018  9:57 AM  Resp 11 04/23/2018  9:57 AM  SpO2 100 % 04/23/2018  9:57 AM  Vitals shown include unvalidated device data.  Last Pain:  Vitals:   04/23/18 0545  TempSrc: Oral      Patients Stated Pain Goal: 4 (72/62/03 5597)  Complications: No apparent anesthesia complications

## 2018-04-24 ENCOUNTER — Encounter (HOSPITAL_COMMUNITY): Payer: Self-pay | Admitting: Surgery

## 2018-05-15 DIAGNOSIS — C61 Malignant neoplasm of prostate: Secondary | ICD-10-CM | POA: Diagnosis not present

## 2018-05-21 DIAGNOSIS — R338 Other retention of urine: Secondary | ICD-10-CM | POA: Diagnosis not present

## 2018-05-21 DIAGNOSIS — C775 Secondary and unspecified malignant neoplasm of intrapelvic lymph nodes: Secondary | ICD-10-CM | POA: Diagnosis not present

## 2018-05-21 DIAGNOSIS — C61 Malignant neoplasm of prostate: Secondary | ICD-10-CM | POA: Diagnosis not present

## 2018-06-11 ENCOUNTER — Telehealth: Payer: Self-pay | Admitting: Oncology

## 2018-06-11 ENCOUNTER — Encounter: Payer: Self-pay | Admitting: Oncology

## 2018-06-11 NOTE — Telephone Encounter (Signed)
New referral received from Dr. Gloriann Loan at [facility] for prostate cancer. Pt has been scheduled to see Dr. Alen Blew on 8/27 at 11am. I offered an earlier appt date and time, but the pt stated he will not be in time. Pt aware to arrive 30 minutes early. Letter mailed.

## 2018-06-30 ENCOUNTER — Encounter: Payer: Self-pay | Admitting: Medical Oncology

## 2018-06-30 ENCOUNTER — Inpatient Hospital Stay: Payer: Medicare Other | Attending: Oncology | Admitting: Oncology

## 2018-06-30 ENCOUNTER — Telehealth: Payer: Self-pay | Admitting: Pharmacist

## 2018-06-30 ENCOUNTER — Telehealth: Payer: Self-pay

## 2018-06-30 VITALS — BP 148/80 | HR 70 | Temp 98.5°F | Resp 17 | Ht 70.0 in | Wt 173.9 lb

## 2018-06-30 DIAGNOSIS — Z79818 Long term (current) use of other agents affecting estrogen receptors and estrogen levels: Secondary | ICD-10-CM | POA: Diagnosis not present

## 2018-06-30 DIAGNOSIS — Z79899 Other long term (current) drug therapy: Secondary | ICD-10-CM

## 2018-06-30 DIAGNOSIS — C61 Malignant neoplasm of prostate: Secondary | ICD-10-CM

## 2018-06-30 DIAGNOSIS — Z7952 Long term (current) use of systemic steroids: Secondary | ICD-10-CM

## 2018-06-30 NOTE — Telephone Encounter (Signed)
Oral Oncology Patient Advocate Encounter  Patient is uninsured and unsure of whether he wants to start on [place] or Xtandi. I met with the patient while he was in the office today and explained to him the manufacturer assistance process and had him sign the forms.  Later today he brought in his income document and I made a copy of it to send with his applications. As soon as I get the doctors signature I will fax the applications.  [place] Patient [place] Phone [phone] Fax [phone]

## 2018-06-30 NOTE — Telephone Encounter (Signed)
Oral Oncology Pharmacist Encounter  Received new referral to work up patient for either Zytiga (abiraterone) or Xtandi (enzalutamide) for the treatment of metastatic, castration-sensitive prostate cancer in conjunction with androgen deprivation therapy, planned duration until disease progression or unacceptable toxicity.  Patient has been provided information for both medications in order to make informed decision.  Labs from Epic assessed, no organ dysfunction noted.  Current medication list in Epic reviewed, no significant DDIs with Zytiga or Xtandi identified.  Patient without prescription insurance coverage. Oral Oncology Patient Advocate will assist patient in applying for manufacturer copayment assistance in an effort to obtain the chosen medication at $0 out of pocket cost.  Oral Oncology Clinic will continue to follow.  Johny Drilling, PharmD, BCPS, BCOP  06/30/2018 1:29 PM Oral Oncology Clinic [phone]

## 2018-06-30 NOTE — Progress Notes (Signed)
Reason for the request:    Prostate cancer  HPI: I was asked by Dr. Gloriann Loan to evaluate Phillip Brown for a diagnosis of prostate cancer.  He is a pleasant 67 year old man who is diagnosed with prostate cancer in February 2019.  At that time he presented with urinary retention and was evaluated by Dr. Gloriann Loan at that time and underwent a prostate biopsy in February 2019.  The biopsy showed Gleason score 4+5 = 9 and 2 cores with 6 other cores of Gleason score 8.  There is also one core Gleason score 7 and a Gleason score of 6.  At that time his PSA was 118.  His staging work-up showed a large prostatic mass with left external iliac lymph node adenopathy.  His bone scan did not show any evidence of bony metastasis.  He was started on Firmagon by Dr. Gloriann Loan with PSA decline to 17.9 in April 2019 and subsequently down to 2.57 in July 2019.   Clinically, he reports significant improvement in his overall health and improving in his urination ability.  He had a Foley catheter placed up till July 2019 and has been removed since that time.  He remains on Flomax which have helped his urination at this time.  He does report some mild nocturia but no other complaints.  His appetite is excellent and his performance status is back to baseline.  He denies any bone pain or pathological fractures.  He does not report any headaches, blurry vision, syncope or seizures. Does not report any fevers, chills or sweats.  Does not report any cough, wheezing or hemoptysis.  Does not report any chest pain, palpitation, orthopnea or leg edema.  Does not report any nausea, vomiting or abdominal pain.  Does not report any constipation or diarrhea.  Does not report any skeletal complaints.    Does not report frequency, urgency or hematuria.  Does not report any skin rashes or lesions. Does not report any heat or cold intolerance.  Does not report any lymphadenopathy or petechiae.  Does not report any anxiety or depression.  Remaining review of  systems is negative.    Past Medical History:  Diagnosis Date  . Bilateral inguinal hernia   . BPH (benign prostatic hyperplasia)   . Prostate cancer [facility])    being managed by Dr Link Snuffer with [facility]   . Urinary catheter in place   :  Past Surgical History:  Procedure Laterality Date  . APPENDECTOMY     age 4   . HERNIA REPAIR  1980   Inguinal; Dr Burgess Amor   . INGUINAL HERNIA REPAIR Bilateral 04/23/2018   Procedure: LAPAROSCOPIC BILATERAL INGUINAL HERNIA REPAIRS ERAS PATHWAY BILATERAL TAP BLOCK;  Surgeon: Michael Boston, MD;  Location: [facility];  Service: General;  Laterality: Bilateral;  . INSERTION OF MESH Bilateral 04/23/2018   Procedure: INSERTION OF MESH;  Surgeon: Michael Boston, MD;  Location: [facility];  Service: General;  Laterality: Bilateral;  . LAPAROSCOPIC LYSIS OF ADHESIONS  04/23/2018   Procedure: LAPAROSCOPIC LYSIS OF ADHESIONS;  Surgeon: Michael Boston, MD;  Location: [facility];  Service: General;;  . 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  acetaminophen (TYLENOL) 500 MG tablet, Take 500-1,000 mg by mouth every 6 (six) hours as needed (for pain or headaches)., Disp: , Rfl:  .  BLACK PEPPER-TURMERIC PO, Take 1 tablet by mouth daily., Disp: ,  Rfl:  .  Leuprolide Acetate (LUPRON IJ), Inject 1 Dose as directed every 6 (six) months., Disp: , Rfl:  .  Multiple Vitamin (MULTIVITAMIN WITH MINERALS) TABS tablet, Take 2 tablets by mouth daily., Disp: , Rfl:  .  Multiple Vitamin (MULTIVITAMIN) tablet, Take 2 tablets by mouth 2 (two) times daily. "bone up" calcium enriched multivitamin, Disp: , Rfl:  .  Multiple Vitamins-Minerals (PRESERVISION AREDS) CAPS, Take 1 capsule by mouth daily., Disp: , Rfl:  .  naproxen (NAPROSYN) 500 MG tablet, Take 1 tablet (500 mg total) by mouth 2 (two) times daily with a meal., Disp: 40 tablet, Rfl: 1 .  OVER THE COUNTER MEDICATION, Take 1  tablet by mouth daily as needed (energy). "blood builder" iron complex, Disp: , Rfl:  .  oxyCODONE (OXY IR/ROXICODONE) 5 MG immediate release tablet, Take 1-2 tablets (5-10 mg total) by mouth every 6 (six) hours as needed for moderate pain, severe pain or breakthrough pain., Disp: 30 tablet, Rfl: 0 .  tamsulosin (FLOMAX) 0.4 MG CAPS capsule, Take 1 capsule (0.4 mg total) by mouth daily. (Patient not taking: Reported on 04/08/2018), Disp: 30 capsule, Rfl: 0 .  Tryptophan 500 MG TABS, Take 500 mg by mouth at bedtime as needed (sleep)., Disp: , Rfl: :  No Known Allergies:  No family history on file.:  Social History   Socioeconomic History  . Marital status: Married    Spouse name: Not on file  . Number of children: Not on file  . Years of education: Not on file  . Highest education level: Not on file  Occupational History  . Not on file  Social Needs  . Financial resource strain: Not on file  . Food insecurity:    Worry: Not on file    Inability: Not on file  . Transportation needs:    Medical: Not on file    Non-medical: Not on file  Tobacco Use  . Smoking status: Never Smoker  . Smokeless tobacco: Never Used  Substance and Sexual Activity  . Alcohol use: Yes    Comment: occasionally   . Drug use: No  . Sexual activity: Not on file  Lifestyle  . Physical activity:    Days per week: Not on file    Minutes per session: Not on file  . Stress: Not on file  Relationships  . Social connections:    Talks on phone: Not on file    Gets together: Not on file    Attends religious service: Not on file    Active member of club or organization: Not on file    Attends meetings of clubs or organizations: Not on file    Relationship status: Not on file  . Intimate partner violence:    Fear of current or ex partner: Not on file    Emotionally abused: Not on file    Physically abused: Not on file    Forced sexual activity: Not on file  Other Topics Concern  . Not on file  Social  History Narrative  . Not on file  :  Pertinent items are noted in HPI.  Exam: Blood pressure (!) 148/80, pulse 70, temperature 98.5 F (36.9 C), temperature source Oral, resp. rate 17, height 5\' 10"  (1.778 m), weight 173 lb 14.4 oz (78.9 kg), SpO2 100 %. ECOG 0  General appearance: alert and cooperative appeared without distress. Head: atraumatic without any abnormalities. Eyes: conjunctivae/corneas clear. PERRL.  Sclera anicteric. Throat: lips, mucosa, and tongue normal; without oral thrush or  ulcers. Resp: clear to auscultation bilaterally without rhonchi, wheezes or dullness to percussion. Cardio: regular rate and rhythm, S1, S2 normal, no murmur, click, rub or gallop GI: soft, non-tender; bowel sounds normal; no masses,  no organomegaly Skin: Skin color, texture, turgor normal. No rashes or lesions Lymph nodes: Cervical, supraclavicular, and axillary nodes normal. Neurologic: Grossly normal without any motor, sensory or deep tendon reflexes. Musculoskeletal: No joint deformity or effusion.  CBC    Component Value Date/Time   WBC 5.1 04/16/2018 1105   RBC 4.32 04/16/2018 1105   HGB 14.3 04/16/2018 1105   HCT 41.6 04/16/2018 1105   PLT 213 04/16/2018 1105   MCV 96.3 04/16/2018 1105   MCH 33.1 04/16/2018 1105   MCHC 34.4 04/16/2018 1105   RDW 13.5 04/16/2018 1105     Chemistry      Component Value Date/Time   NA 140 04/16/2018 1105   K 5.1 04/16/2018 1105   CL 104 04/16/2018 1105   CO2 28 04/16/2018 1105   BUN 15 04/16/2018 1105   CREATININE 0.96 04/16/2018 1105      Component Value Date/Time   CALCIUM 9.3 04/16/2018 1105     EXAM: NUCLEAR MEDICINE WHOLE BODY BONE SCAN  TECHNIQUE: Whole body anterior and posterior images were obtained approximately 3 hours after intravenous injection of radiopharmaceutical.  RADIOPHARMACEUTICALS:  Twenty mCi Technetium-32m MDP IV  COMPARISON:  CT abdomen pelvis from same day.  FINDINGS: No suspicious bone uptake.  There is degenerative-type uptake within the bilateral acromioclavicular and knee joints. Physiologic uptake within the kidneys and bladder. Irregular appearance of the bladder uptake corresponds to the herniated bladder dome in the left inguinal canal as seen on CT from same day.  IMPRESSION: No evidence of osseous metastatic disease.   Assessment and Plan:   68 year old man with the following:  1.  Prostate cancer diagnosed in February 2019.  He presented with a PSA of 118, Gleason score of 4+5 = 9 and 2 cores with multiple other cores with pattern 8 and 7.  He has metastatic disease with pelvic lymphadenopathy but no bone metastasis.  He was started on Firmagon with a PSA decline that is slow but remains detectable.  The natural course of advanced castration-sensitive prostate cancer was discussed today with the patient.  Options of treatment were also reviewed.  Androgen deprivation therapy remains the backbone of treating his prostate cancer and needs to be continued indefinitely.  The role of additional therapy was reviewed today with her in the form of Xtandi, Zytiga or systemic chemotherapy was reviewed.  This will be appropriate either way for the castration-sensitive or he is developing castration-resistant disease.  Risks and benefits of all these options were reviewed today.  Long-term complications were also discussed.  As well as side effects associated with both Zytiga and Xtandi.  These complications include nausea, fatigue, edema, hypertension and hypokalemia.  After discussion today, written information was given to the patient and he will determine whether he wants to proceed with this therapy or not.  He appears to be leaning towards as Zytiga and prednisone which is certainly a effective option at this time.  2.  Androgen deprivation: I recommended continuing Lupron indefinitely under the care of Dr. Gloriann Loan.  3.  Prognosis and goals of care: This was discussed today with  the patient in detail.  The natural course of prostate cancer that is advanced was reviewed as well as life expectancy.  He understands any treatment is palliative at this time although  his disease can be palliated for an extended period of time.  His performance status is excellent and aggressive therapy is warranted I anticipate reasonable disease control for the next 3 to 5 years on average.  4.  Follow-up: Will be determined depending on his decision whether to add Zytiga or not at this time.  60  minutes was spent with the patient face-to-face today.  More than 50% of time was dedicated to discussing his diagnosis, reviewing imaging studies and reviewing treatment options.   Thank you for the referral. A copy of this consult has been forwarded to the requesting physician.

## 2018-06-30 NOTE — Telephone Encounter (Signed)
Oral Chemotherapy Pharmacist Encounter   I spoke with patient and wife in exam room for overview of: Zytiga.   Counseled patient on administration, dosing, side effects, monitoring, drug-food interactions, safe handling, storage, and disposal.  Patient will take Zytiga 250mg  tablets, 4 tablets (1000mg ) by mouth once daily on an empty stomach, 1 hour before or 2 hours after a meal.  Patient will take prednisone 5mg  tablet, 1 tablet by mouth one daily with breakfast.  Zytiga start date: TBD, pending medication acquisition  Adverse effects include but are not limited to: peripheral edema, GI upset, hypertension, hot flashes, fatigue, and arthralgias.    Prednisone prescription will need to be sent to [facility] on [address]. Patient will obtain prednisone and knows to start prednisone on the same day as Zytiga start.  Reviewed with patient importance of keeping a medication schedule and plan for any missed doses.  Mr. Anselmi voiced understanding and appreciation.   All questions answered. Medication reconciliation performed and medication/allergy list updated.  Patient without prescription insurance coverage.  We extensively discussed manufacturer compassionate use program. Patient instructed that if they are enrolled into manufacturer compassionate use program that medication will be shipped directly to their home from dispensing pharmacy of program's choosing.  Application will be updated in a separate encounter.  Patient knows to call the office with questions or concerns.  Oral Oncology Clinic will continue to follow.  Johny Drilling, PharmD, BCPS, BCOP  06/30/2018 1:29 PM Oral Oncology Clinic [phone]

## 2018-07-01 ENCOUNTER — Telehealth: Payer: Self-pay

## 2018-07-01 NOTE — Telephone Encounter (Signed)
Per 8/27 no los °

## 2018-07-02 NOTE — Progress Notes (Signed)
Introduced myself to Phillip Brown and his wife as the prostate nurse navigator and my role. He was diagnosed with prostate cancer in February and started ADT as treatment. He consulted with Dr. Alen Blew regarding added treatment for his cancer. Dr. Alen Blew discussed [name] and [name] and patient is undecided if he wants to take. He was given patient drug information on both drugs and I informed him he would be contacted by the oral chemo pharmacist. I gave them my card and asked them to call me with questions or concerns.

## 2018-07-03 ENCOUNTER — Telehealth: Payer: Self-pay | Admitting: *Deleted

## 2018-07-03 ENCOUNTER — Other Ambulatory Visit: Payer: Self-pay | Admitting: Oncology

## 2018-07-03 ENCOUNTER — Telehealth: Payer: Self-pay | Admitting: Oncology

## 2018-07-03 DIAGNOSIS — C61 Malignant neoplasm of prostate: Secondary | ICD-10-CM

## 2018-07-03 MED ORDER — PREDNISONE 5 MG PO TABS: 5.0000 mg | ORAL_TABLET | Freq: Every day | ORAL | 3 refills | Status: DC

## 2018-07-03 MED ORDER — ABIRATERONE ACETATE 250 MG PO TABS: 1000.0000 mg | ORAL_TABLET | Freq: Every day | ORAL | 0 refills | Status: DC

## 2018-07-03 NOTE — Telephone Encounter (Signed)
Received voice mail message from patient stating,"please tell Dr. Alen Blew that I have decided to start the prostate cancer treatment Zytiga and Prednisone. No need to call me back."

## 2018-07-03 NOTE — Telephone Encounter (Signed)
Scheduled appt per 8/30 sch message - pt is aware of appt date and time 

## 2018-07-03 NOTE — Telephone Encounter (Signed)
RX for Phillip Brown will be started.

## 2018-07-14 DIAGNOSIS — H40013 Open angle with borderline findings, low risk, bilateral: Secondary | ICD-10-CM | POA: Diagnosis not present

## 2018-07-14 DIAGNOSIS — H40033 Anatomical narrow angle, bilateral: Secondary | ICD-10-CM | POA: Diagnosis not present

## 2018-07-14 DIAGNOSIS — H2513 Age-related nuclear cataract, bilateral: Secondary | ICD-10-CM | POA: Diagnosis not present

## 2018-07-14 NOTE — Telephone Encounter (Signed)
Oral Oncology Patient Advocate Encounter  [name] has since decided he would take Zytiga. I faxed the application along with his income documents on 8/27.  Will continue to follow.  [place] Patient [place] Phone [phone] Fax [phone]

## 2018-07-15 NOTE — Telephone Encounter (Signed)
Oral Oncology Patient Advocate Encounter  I received a denial letter from [employer] based on him not meeting the income requirement.  I wrote an appeal letter and faxed it to [facility] and [place] today, 07/15/18.  I called Mr. Lowy and had to leave a message.  [place] Patient [place] Phone [phone] Fax [phone]

## 2018-08-04 ENCOUNTER — Telehealth: Payer: Self-pay | Admitting: Pharmacist

## 2018-08-04 NOTE — Telephone Encounter (Signed)
Oral Oncology Patient Advocate Encounter  I called Wynetta Emery and [employer] Emer[employer] to follow up on patient assistance application appeal and the appeal has been Denied.  I have faxed the [facility] manufacturer assistance application today.   [place] Patient [place] Phone [phone] Fax [phone]

## 2018-08-04 NOTE — Telephone Encounter (Addendum)
Oral Oncology Pharmacist Encounter  Received email from patient on 07/27/2018 with request to investigate some alternative medicines for the treatment of prostate cancer. Patient requested this pharmacist research the supplements genistein combined polysaccharide (GCP) in concert with active hexose correlated compound [facility]) to reduce tumors and cancers in the prostate and lymph nodes in humans.  Limited information on both agents is available. Any published study that I was able to locate was in a small number of patients and did not include important endpoints such as progression free survival or overall survival.  Discussed above with Dr. Alen Blew. We would not recommend either of these agents as a substitute for previously recommended treatment options such as Zytiga or Xtandi based on limited information that is available. No information that is available for either GCP or AHCC are comparable to a large body of evidence that support the use of either Zytiga or Xtandi for this patient's disease treatment.  I called and left voicemail on cell phone and home phone numbers available in the EMR. I will provide above information to patient once able to speak with him or his wife, Rod Holler.  Johny Drilling, PharmD, BCPS, BCOP  08/04/2018 11:03 AM Oral Oncology Clinic [phone]

## 2018-08-04 NOTE — Telephone Encounter (Signed)
Oral Oncology Pharmacist Encounter  Spoke with patient. Informed patient about limited available information for GCP or AHCC and that endpoint studied for these agents are not as robust as progression free survival or overall survival that would traditionally be used for approval from the FDA. Due to this, we cannot support the use of either of these agents for the treatment of prostate cancer. Treatment recommendations continue to include Zytiga or Xtandi in addition to androgen deprivation therapy.  Patient without prescription insurance coverage.  Patient has been denied for enrollment into [facility] patient assistance program (JJPAF) to receive Zytiga at no out-of-pocket cost. Oral oncology clinic appealed this denial and now the denial has also been upheld.  Application for Xtandi support solutions (XSS) in an attempt to receive Xtandi at $0 out-of-pocket cost to patient through their compassionate use program has been faxed today.  Informed patient on JJPAF denial and the fact that we have sent the application for XSS.  Patient expressed understanding and appreciation. Patient knows to call the office with any additional questions or concerns. Patient will review available treatment options with Dr. Alen Blew when in the office on 08/06/2018.  Johny Drilling, PharmD, BCPS, BCOP  08/04/2018 12:04 PM Oral Oncology Clinic [phone]

## 2018-08-06 ENCOUNTER — Telehealth: Payer: Self-pay | Admitting: Oncology

## 2018-08-06 ENCOUNTER — Inpatient Hospital Stay: Payer: Medicare Other | Attending: Oncology | Admitting: Oncology

## 2018-08-06 ENCOUNTER — Inpatient Hospital Stay: Payer: Medicare Other

## 2018-08-06 VITALS — BP 141/74 | HR 70 | Temp 98.3°F | Resp 18 | Ht 70.0 in | Wt 168.1 lb

## 2018-08-06 DIAGNOSIS — C61 Malignant neoplasm of prostate: Secondary | ICD-10-CM

## 2018-08-06 DIAGNOSIS — Z79818 Long term (current) use of other agents affecting estrogen receptors and estrogen levels: Secondary | ICD-10-CM

## 2018-08-06 DIAGNOSIS — Z7952 Long term (current) use of systemic steroids: Secondary | ICD-10-CM | POA: Diagnosis not present

## 2018-08-06 DIAGNOSIS — Z79899 Other long term (current) drug therapy: Secondary | ICD-10-CM

## 2018-08-06 LAB — CMP (CANCER CENTER ONLY)
ALK PHOS: 49 U/L (ref 38–126)
ALT: 26 U/L (ref 0–44)
ANION GAP: 9 (ref 5–15)
AST: 21 U/L (ref 15–41)
Albumin: 3.8 g/dL (ref 3.5–5.0)
BUN: 13 mg/dL (ref 8–23)
CALCIUM: 9.9 mg/dL (ref 8.9–10.3)
CHLORIDE: 103 mmol/L (ref 98–111)
CO2: 29 mmol/L (ref 22–32)
Creatinine: 0.86 mg/dL (ref 0.61–1.24)
GFR, Est AFR Am: >60 mL/min (ref >60)
GFR, Estimated: >60 mL/min (ref >60)
Glucose, Bld: 108 mg/dL — ABNORMAL HIGH (ref 70–99)
Potassium: 4 mmol/L (ref 3.5–5.1)
SODIUM: 141 mmol/L (ref 135–145)
Total Bilirubin: 0.2 mg/dL — ABNORMAL LOW (ref 0.3–1.2)
Total Protein: 7 g/dL (ref 6.5–8.1)

## 2018-08-06 LAB — CBC WITH DIFFERENTIAL (CANCER CENTER ONLY)
BASOS PCT: 1 %
Basophils Absolute: 0 10*3/uL (ref 0.0–0.1)
Eosinophils Absolute: 0.1 10*3/uL (ref 0.0–0.5)
Eosinophils Relative: 2 %
HEMATOCRIT: 40 % (ref 38.4–49.9)
Hemoglobin: 13.6 g/dL (ref 13.0–17.1)
LYMPHS ABS: 1.6 10*3/uL (ref 0.9–3.3)
Lymphocytes Relative: 31 %
MCH: 32.7 pg (ref 27.2–33.4)
MCHC: 34.1 g/dL (ref 32.0–36.0)
MCV: 96.1 fL (ref 79.3–98.0)
Monocytes Absolute: 0.4 10*3/uL (ref 0.1–0.9)
Monocytes Relative: 7 %
NEUTROS ABS: 3.1 10*3/uL (ref 1.5–6.5)
Neutrophils Relative %: 59 %
Platelet Count: 208 10*3/uL (ref 140–400)
RBC: 4.16 MIL/uL — ABNORMAL LOW (ref 4.20–5.82)
RDW: 13 % (ref 11.0–14.6)
WBC Count: 5.3 10*3/uL (ref 4.0–10.3)

## 2018-08-06 NOTE — Progress Notes (Signed)
Hematology and Oncology Follow Up Visit  Phillip Brown [id number] May 29, 1951 67 y.o. 08/06/2018 3:20 PM System, Pcp Not InBell, Phillip Hane, MD   Principle Diagnosis: 67 year old gentleman with castration-sensitive prostate cancer diagnosed and February 2019.  His PSA was 118, Gleason score 4+5 = 9 with disease in the lymph nodes and no bone metastasis.   Prior Therapy:  Prostate biopsy in February 2019 which showed Gleason score 4+5 = 9 and 2 cores with 6 other cores of Gleason score 8.     Current therapy:  Lupron under the care of Dr. Gloriann Loan.  Under consideration to start additional therapy.  Fabio Asa was not approved and currently exploring the option of Xtandi.   Interim History: [name] presents today for a follow-up visit.  Since the last visit, he reports no major changes in his health.  He was not able to start Zytiga because of financial reasons and the drug has not been approved.  He continues to receive Lupron under the care of Dr. Gloriann Loan without any complications.  He denies any worsening bone pain or pathological fractures.  He denies any changes in his performance status.  His quality of life remain excellent with excellent performance status.  He does not report any headaches, blurry vision, syncope or seizures. Does not report any fevers, chills or sweats.  Does not report any cough, wheezing or hemoptysis.  Does not report any chest pain, palpitation, orthopnea or leg edema.  Does not report any nausea, vomiting or abdominal pain.  Does not report any constipation or diarrhea.  Does not report any skeletal complaints.    Does not report frequency, urgency or hematuria.  Does not report any skin rashes or lesions. Does not report any heat or cold intolerance.  Does not report any lymphadenopathy or petechiae.  Does not report any anxiety or depression.  Remaining review of systems is negative.    Medications: I have reviewed the patient's current medications.  Current  Outpatient Medications  Medication Sig Dispense Refill  . abiraterone acetate (ZYTIGA) 250 MG tablet Take 4 tablets (1,000 mg total) by mouth daily. Take on an empty stomach 1 hour before or 2 hours after a meal 120 tablet 0  . acetaminophen (TYLENOL) 500 MG tablet Take 500-1,000 mg by mouth every 6 (six) hours as needed (for pain or headaches).    Marland Kitchen BLACK PEPPER-TURMERIC PO Take 1 tablet by mouth daily.    Marland Kitchen Leuprolide Acetate (LUPRON IJ) Inject 1 Dose as directed every 6 (six) months.    . Multiple Vitamin (MULTIVITAMIN WITH MINERALS) TABS tablet Take 2 tablets by mouth daily.    . Multiple Vitamin (MULTIVITAMIN) tablet Take 2 tablets by mouth 2 (two) times daily. "bone up" calcium enriched multivitamin    . Multiple Vitamins-Minerals (PRESERVISION AREDS) CAPS Take 1 capsule by mouth daily.    . naproxen (NAPROSYN) 500 MG tablet Take 1 tablet (500 mg total) by mouth 2 (two) times daily with a meal. (Patient not taking: Reported on 06/30/2018) 40 tablet 1  . OVER THE COUNTER MEDICATION Take 1 tablet by mouth daily as needed (energy). "blood builder" iron complex    . oxyCODONE (OXY IR/ROXICODONE) 5 MG immediate release tablet Take 1-2 tablets (5-10 mg total) by mouth every 6 (six) hours as needed for moderate pain, severe pain or breakthrough pain. (Patient not taking: Reported on 06/30/2018) 30 tablet 0  . predniSONE (DELTASONE) 5 MG tablet Take 1 tablet (5 mg total) by mouth daily with breakfast. 90  tablet 3  . tamsulosin (FLOMAX) 0.4 MG CAPS capsule Take 1 capsule (0.4 mg total) by mouth daily. (Patient not taking: Reported on 04/08/2018) 30 capsule 0  . Tryptophan 500 MG TABS Take 500 mg by mouth at bedtime as needed (sleep).     No current facility-administered medications for this visit.      Allergies: No Known Allergies  Past Medical History, Surgical history, Social history, and Family History were reviewed and updated.  Review of Systems:  Remaining ROS negative.  Physical  Exam: Blood pressure (!) 141/74, pulse 70, temperature 98.3 F (36.8 C), temperature source Oral, resp. rate 18, height 5\' 10"  (1.778 m), weight 168 lb 1.6 oz (76.2 kg), SpO2 99 %.   ECOG: 0  General appearance: Comfortable appearing without any discomfort Head: Normocephalic without any trauma Oropharynx: Mucous membranes are moist and pink without any thrush or ulcers. Eyes: Pupils are equal and round reactive to light. Lymph nodes: No cervical, supraclavicular, inguinal or axillary lymphadenopathy.   Heart:regular rate and rhythm.  S1 and S2 without leg edema. Lung: Clear without any rhonchi or wheezes.  No dullness to percussion. Abdomin: Soft, nontender, nondistended with good bowel sounds.  No hepatosplenomegaly. Musculoskeletal: No joint deformity or effusion.  Full range of motion noted. Neurological: No deficits noted on motor, sensory and deep tendon reflex exam. Skin: No petechial rash or dryness.  Appeared moist.  Psychiatric: Mood and affect appeared appropriate.   Lab Results: Lab Results  Component Value Date   WBC 5.3 08/06/2018   HGB 13.6 08/06/2018   HCT 40.0 08/06/2018   MCV 96.1 08/06/2018   PLT 208 08/06/2018     Chemistry      Component Value Date/Time   NA 140 04/16/2018 1105   K 5.1 04/16/2018 1105   CL 104 04/16/2018 1105   CO2 28 04/16/2018 1105   BUN 15 04/16/2018 1105   CREATININE 0.96 04/16/2018 1105      Component Value Date/Time   CALCIUM 9.3 04/16/2018 1105       Impression and Plan:  67 year old man with:  1.    Castration-sensitive prostate cancer diagnosed in February 2019.  He presented with a PSA of 118, Gleason score of 4+5 = 9 and pelvic lymphadenopathy but no bone metastasis.    We have discussed adding Zytiga therapy and last visit but his medication was not approved because of insurance coverage.  Alternative options were discussed today in details today including his disease status as well as future projections relating  to his cancer prognosis.  Alternative therapy would include Xtandi and apalutamide which have shown improvement in this particular space.  Taxotere chemotherapy was also discussed today in detail as a potential alternative given his drug prescription coverage issues.  Complication associated with chemotherapy was reviewed in detail which include nausea, vomiting, myelosuppression peripheral neuropathy among others.  After discussion today, his preference is to try to [facility] or apalutamide if possible.  Complications associated with Xtandi include nausea, fatigue, hypertension and rarely seizures.  Complication associated with apalutamide were also reviewed in case this would be his next option.  He had a lot of questions regarding alternative therapies that have not been approved for this particular disease.  I encouraged him against using any supplements or and sectioned medication at this time as we do not know how they would interact with proven therapies.  2.  Androgen deprivation: This will be continued indefinitely at this time.  He is agreeable on long-term complications have been  discussed.  3.  Prognosis and goals of care: This was reiterated today.  His disease is incurable but his performance status and activity level remain excellent and aggressive therapy is warranted.  His disease can be palliated for a period of time.   4.  Follow-up: Will be in 5 weeks to follow his progress.  30  minutes was spent with the patient face-to-face today.  More than 50% of time was dedicated to reviewing his disease status, treatment options, complications related to therapy and coordinating plan of care.Zola Button, MD 10/3/20193:20 PM

## 2018-08-06 NOTE — Telephone Encounter (Signed)
Gave pt avs and calendar  °

## 2018-08-09 LAB — PROSTATE-SPECIFIC AG, SERUM (LABCORP): Prostate Specific Ag, Serum: 1.4 ng/mL (ref 0.0–4.0)

## 2018-08-10 ENCOUNTER — Telehealth: Payer: Self-pay | Admitting: *Deleted

## 2018-08-10 NOTE — Telephone Encounter (Signed)
-----   Message from Wyatt Portela, MD sent at 08/10/2018  8:00 AM EDT ----- Please let him know his PSA is low

## 2018-08-10 NOTE — Telephone Encounter (Signed)
Spoke with patient. gave results of last PSA

## 2018-08-11 NOTE — Telephone Encounter (Signed)
Oral Oncology Patient Advocate Encounter  I faxed the [employer] manufacturer assistance application 28/8/33. I called today to check the status and they have verified that he had no insurance coverage and expect the process to be completed by the end of this week  Will update with final determination  [place] Patient [place] Phone [phone] Fax [phone]

## 2018-08-20 ENCOUNTER — Telehealth: Payer: Self-pay | Admitting: Pharmacist

## 2018-08-20 DIAGNOSIS — C61 Malignant neoplasm of prostate: Secondary | ICD-10-CM

## 2018-08-20 MED ORDER — ENZALUTAMIDE 40 MG PO CAPS: 160.0000 mg | ORAL_CAPSULE | Freq: Every day | ORAL | 0 refills | Status: DC

## 2018-08-20 NOTE — Telephone Encounter (Signed)
Oral Chemotherapy Pharmacist Encounter  Received call from patient to inform the office that he had been approved to receive Xtandi from the manufacturer and no out-of-pocket cost to the patient. He had schedule delivery of his [name] to arrive at his home today, 08/20/2018.  We discussed Xtandi (enzalutamide) for the treatment of metastatic, castration-sensitive prostate cancer in conjunction with androgen deprivation therapy, planned duration until disease progression or unacceptable toxicity.   Counseled patient on administration, dosing, side effects, monitoring, drug-food interactions, safe handling, storage, and disposal.  Patient will take Xtandi 40mg  capsules, 4 capsules (160mg ) by mouth once daily without regard to food.  Xtandi start date: 08/24/2018  Adverse effects include but are not limited to: peripheral edema, GI upset, hypertension, hot flashes, fatigue, falls/fractures, and arthralgias.   Patient instructed about small risk of seizures with Xtandi treatment.  Reviewed with patient importance of keeping a medication schedule and plan for any missed doses.  Phillip Brown voiced understanding and appreciation.   All questions answered. Medication reconciliation performed and medication/allergy list updated.  Patient stated his 1st shipment of Xtandi from [place] should arrive today and he plans to start early next week. Office visit on 09/11/2018 confirmed with patient.  Patient knows to call the office with questions or concerns.  Oral Oncology Clinic will continue to follow.  Phillip Brown, PharmD, BCPS, BCOP  08/20/2018   9:56 AM Oral Oncology Clinic [phone]

## 2018-08-26 DIAGNOSIS — C61 Malignant neoplasm of prostate: Secondary | ICD-10-CM | POA: Diagnosis not present

## 2018-08-26 DIAGNOSIS — C775 Secondary and unspecified malignant neoplasm of intrapelvic lymph nodes: Secondary | ICD-10-CM | POA: Diagnosis not present

## 2018-08-26 NOTE — Telephone Encounter (Signed)
Oral Oncology Patient Advocate Encounter  Phillip Brown has been approved 08/13/18-11/03/18. It was shipped to the patient on 08/19/18.  I called the patient and he started taking Xtandi on 08/24/2018  [place] Patient [name] Phone [phone] Fax [phone]

## 2018-08-31 ENCOUNTER — Telehealth: Payer: Self-pay | Admitting: Pharmacist

## 2018-08-31 NOTE — Telephone Encounter (Signed)
Oral Oncology Pharmacist Encounter  Received call from patient with questions about Xtandi administration time. Patient states he had been taking his Xtandi before bed with a full glass of water. He states that he had to get up to urinate every 30 minutes between 10 PM and 3 AM last night, so did not end up with much sleep.  Patient informed he can take his [name] anytime of day as it can be given with or without food. Patient instructed to move Xtandi administration time up by 4 hours each day to get Xtandi administration closer to lunchtime.  He had been administering Xtandi at bedtime so that he is able to sleep through such side effects as dizziness and fatigue. We did discuss that Xtandi crosses the blood-brain barrier, but that any side effects that he would experience due to this would occur regardless of Xtandi administration time.  Patient states he also discontinued use of Flomax last week at the direction of his urologist. Upon further questioning, patient states that he does not think he was able to fully empty his bladder during any of his attempts at urination last night.  Patient instructed to continue drinking 2 quarts of water each day. Patient instructed to move water administration time up earlier so that he can finish his first quart before about 11 AM and finished his second quart prior to 5 PM.  Patient will also resume Flomax administration with the goal of decreasing nighttime urination.  All questions answered. Patient expressed understanding and appreciation. He knows to call the office with any additional questions or concerns.  Johny Drilling, PharmD, BCPS, BCOP  08/31/2018 2:27 PM Oral Oncology Clinic [phone]

## 2018-09-11 ENCOUNTER — Inpatient Hospital Stay: Payer: Medicare Other | Attending: Oncology

## 2018-09-11 ENCOUNTER — Telehealth: Payer: Self-pay | Admitting: Oncology

## 2018-09-11 ENCOUNTER — Inpatient Hospital Stay (HOSPITAL_BASED_OUTPATIENT_CLINIC_OR_DEPARTMENT_OTHER): Payer: Medicare Other | Admitting: Oncology

## 2018-09-11 VITALS — BP 146/81 | HR 71 | Temp 98.1°F | Resp 18 | Ht 70.0 in | Wt 170.5 lb

## 2018-09-11 DIAGNOSIS — C61 Malignant neoplasm of prostate: Secondary | ICD-10-CM

## 2018-09-11 DIAGNOSIS — Z79899 Other long term (current) drug therapy: Secondary | ICD-10-CM | POA: Insufficient documentation

## 2018-09-11 DIAGNOSIS — C7951 Secondary malignant neoplasm of bone: Secondary | ICD-10-CM

## 2018-09-11 LAB — CBC WITH DIFFERENTIAL (CANCER CENTER ONLY)
ABS IMMATURE GRANULOCYTES: 0.01 10*3/uL (ref 0.00–0.07)
Basophils Absolute: 0 10*3/uL (ref 0.0–0.1)
Basophils Relative: 1 %
EOS PCT: 2 %
Eosinophils Absolute: 0.1 10*3/uL (ref 0.0–0.5)
HCT: 38.2 % — ABNORMAL LOW (ref 39.0–52.0)
HEMOGLOBIN: 12.9 g/dL — AB (ref 13.0–17.0)
Immature Granulocytes: 0 %
LYMPHS PCT: 36 %
Lymphs Abs: 1.8 10*3/uL (ref 0.7–4.0)
MCH: 32.4 pg (ref 26.0–34.0)
MCHC: 33.8 g/dL (ref 30.0–36.0)
MCV: 96 fL (ref 80.0–100.0)
MONO ABS: 0.4 10*3/uL (ref 0.1–1.0)
MONOS PCT: 8 %
NEUTROS ABS: 2.7 10*3/uL (ref 1.7–7.7)
Neutrophils Relative %: 53 %
Platelet Count: 199 10*3/uL (ref 150–400)
RBC: 3.98 MIL/uL — ABNORMAL LOW (ref 4.22–5.81)
RDW: 12.8 % (ref 11.5–15.5)
WBC Count: 5.1 10*3/uL (ref 4.0–10.5)
nRBC: 0 % (ref 0.0–0.2)

## 2018-09-11 LAB — CMP (CANCER CENTER ONLY)
ALBUMIN: 3.5 g/dL (ref 3.5–5.0)
ALT: 23 U/L (ref 0–44)
AST: 20 U/L (ref 15–41)
Alkaline Phosphatase: 47 U/L (ref 38–126)
Anion gap: 10 (ref 5–15)
BILIRUBIN TOTAL: 0.3 mg/dL (ref 0.3–1.2)
BUN: 11 mg/dL (ref 8–23)
CHLORIDE: 104 mmol/L (ref 98–111)
CO2: 26 mmol/L (ref 22–32)
CREATININE: 0.85 mg/dL (ref 0.61–1.24)
Calcium: 9.5 mg/dL (ref 8.9–10.3)
GFR, Est AFR Am: >60 mL/min (ref >60)
GLUCOSE: 106 mg/dL — AB (ref 70–99)
Potassium: 3.8 mmol/L (ref 3.5–5.1)
SODIUM: 140 mmol/L (ref 135–145)
TOTAL PROTEIN: 6.5 g/dL (ref 6.5–8.1)

## 2018-09-11 NOTE — Telephone Encounter (Signed)
Pt requested his appt for a few days after provider request due to new years eve

## 2018-09-11 NOTE — Telephone Encounter (Signed)
Gave pt avs and calendar  °

## 2018-09-11 NOTE — Progress Notes (Signed)
Hematology and Oncology Follow Up Visit  Phillip Brown [id number] 20-Feb-1951 66 y.o. 09/11/2018 3:13 PM System, Pcp Not InBell, [name], MD   Principle Diagnosis: 67 year old man with advanced prostate cancer diagnosed in February 2019 with lymphadenopathy and bone metastasis.Marland Kitchen  He was found to have castration-sensitive disease with PSA was 118, Gleason score 4+5 = 9.    Prior Therapy:  Prostate biopsy in February 2019 which showed Gleason score 4+5 = 9 and 2 cores with 6 other cores of Gleason score 8.     Current therapy:  Lupron under the care of Dr. Gloriann Loan.  Xtandi 160 mg daily started October 2019.   Interim History: Phillip Brown returns today for a repeat evaluation.  Since her last visit, he started [facility] and has tolerated therapy very well.  He does report grade 1 fatigue that has been manageable at this time.  He denies any nausea or GI toxicity.  He denies any worsening back pain or pathological fractures.  He remains active and continues to attend to activities of daily living.  He does report nocturia and has decreased his nighttime fluid intake which have helped with his symptoms.  He denies any dysuria or hematuria.   He does not report any headaches, blurry vision, syncope or seizures.  Denies any confusion or lethargy.  Does not report any fevers, chills or sweats.  Does not report any cough, wheezing or hemoptysis.  Does not report any chest pain, palpitation, orthopnea or leg edema.  Does not report any nausea, vomiting or abdominal pain.  Does not report any changes in bowel habits. Does not report any arthralgias or myalgias.   Does not report frequency, urgency or hematuria.  Does not report any skin rashes or lesions. Does not report any lymphadenopathy or petechiae.  Does not report any mood changes.  Remaining review of systems is negative.    Medications: I have reviewed the patient's current medications.  Current Outpatient Medications  Medication Sig  Dispense Refill  . acetaminophen (TYLENOL) 500 MG tablet Take 500-1,000 mg by mouth every 6 (six) hours as needed (for pain or headaches).    Marland Kitchen BLACK PEPPER-TURMERIC PO Take 1 tablet by mouth daily.    . enzalutamide (XTANDI) 40 MG capsule Take 4 capsules (160 mg total) by mouth daily. 120 capsule 0  . Leuprolide Acetate (LUPRON IJ) Inject 1 Dose as directed every 6 (six) months.    . Multiple Vitamin (MULTIVITAMIN WITH MINERALS) TABS tablet Take 2 tablets by mouth daily.    . Multiple Vitamin (MULTIVITAMIN) tablet Take 2 tablets by mouth 2 (two) times daily. "bone up" calcium enriched multivitamin    . Multiple Vitamins-Minerals (PRESERVISION AREDS) CAPS Take 1 capsule by mouth daily.    . naproxen (NAPROSYN) 500 MG tablet Take 1 tablet (500 mg total) by mouth 2 (two) times daily with a meal. (Patient not taking: Reported on 06/30/2018) 40 tablet 1  . OVER THE COUNTER MEDICATION Take 1 tablet by mouth daily as needed (energy). "blood builder" iron complex    . oxyCODONE (OXY IR/ROXICODONE) 5 MG immediate release tablet Take 1-2 tablets (5-10 mg total) by mouth every 6 (six) hours as needed for moderate pain, severe pain or breakthrough pain. (Patient not taking: Reported on 06/30/2018) 30 tablet 0  . tamsulosin (FLOMAX) 0.4 MG CAPS capsule Take 1 capsule (0.4 mg total) by mouth daily. (Patient not taking: Reported on 04/08/2018) 30 capsule 0  . Tryptophan 500 MG TABS Take 500 mg by mouth  at bedtime as needed (sleep).     No current facility-administered medications for this visit.      Allergies: No Known Allergies  Past Medical History, Surgical history, Social history, and Family History were reviewed and updated.  Review of Systems:  Remaining ROS negative.  Physical Exam: Blood pressure (!) 146/81, pulse 71, temperature 98.1 F (36.7 C), temperature source Oral, resp. rate 18, height 5\' 10"  (1.778 m), weight 170 lb 8 oz (77.3 kg), SpO2 100 %.   ECOG: 0   General appearance: Alert,  awake without any distress. Head: Atraumatic without abnormalities Oropharynx: Without any thrush or ulcers. Eyes: No scleral icterus. Lymph nodes: No lymphadenopathy noted in the cervical, supraclavicular, or axillary nodes Heart:regular rate and rhythm, without any murmurs or gallops.   Lung: Clear to auscultation without any rhonchi, wheezes or dullness to percussion. Abdomin: Soft, nontender without any shifting dullness or ascites. Musculoskeletal: No clubbing or cyanosis. Neurological: No motor or sensory deficits. Skin: No rashes or lesions.    Lab Results: Lab Results  Component Value Date   WBC 5.1 09/11/2018   HGB 12.9 (L) 09/11/2018   HCT 38.2 (L) 09/11/2018   MCV 96.0 09/11/2018   PLT 199 09/11/2018     Chemistry      Component Value Date/Time   NA 141 08/06/2018 1501   K 4.0 08/06/2018 1501   CL 103 08/06/2018 1501   CO2 29 08/06/2018 1501   BUN 13 08/06/2018 1501   CREATININE 0.86 08/06/2018 1501      Component Value Date/Time   CALCIUM 9.9 08/06/2018 1501   ALKPHOS 49 08/06/2018 1501   AST 21 08/06/2018 1501   ALT 26 08/06/2018 1501   BILITOT 0.2 (L) 08/06/2018 1501       Impression and Plan:  67 year old man with:  1.    Castration-sensitive advanced prostate cancer diagnosed in February 2019.  He has started on Xtandi in the last 3 weeks with excellent response to therapy.  His PSA continues to decline without any new complications related to this medication.  Risks and benefits of continuing this therapy long-term was discussed today and is agreeable to continue.  2.  Androgen deprivation: He is currently receiving it under the care of Dr. Gloriann Loan.  His next injection will be scheduled in April 2020.  Long-term complication associated with this therapy was reviewed today which includes weight gain, sexual dysfunction and osteoporosis.  3.  Prognosis and goals of care: Therapy is palliative but this disease appears to be under control and  aggressive therapy is warranted.   4.  Follow-up: Will be in 6 to 8 weeks to follow his progress.  15  minutes was spent with the patient face-to-face today.  More than 50% of time was dedicated to discussing the natural course of his disease, treatment options and managing toxicities related to therapy.Zola Button, MD 11/8/20193:13 PM

## 2018-09-12 LAB — PROSTATE-SPECIFIC AG, SERUM (LABCORP): PROSTATE SPECIFIC AG, SERUM: 0.5 ng/mL (ref 0.0–4.0)

## 2018-09-14 ENCOUNTER — Telehealth: Payer: Self-pay

## 2018-09-14 NOTE — Telephone Encounter (Signed)
Contacted patient and made aware of PSA results. 

## 2018-09-14 NOTE — Telephone Encounter (Signed)
-----   Message from Wyatt Portela, MD sent at 09/14/2018  8:23 AM EST ----- Please let him know his PSA

## 2018-09-23 ENCOUNTER — Telehealth: Payer: Self-pay

## 2018-09-23 NOTE — Telephone Encounter (Signed)
Oral Oncology Patient Advocate Encounter  [employer] manufacturer assitance application will expire 11/03/18. I filled out the renewal application and faxed it 09/23/18. Nothing has changed for the patient. I faxed the application 32/54/98.  This encounter will be updated until final determination  [place] Patient [place] Phone [phone] Fax [phone]

## 2018-10-08 ENCOUNTER — Other Ambulatory Visit: Payer: Self-pay | Admitting: *Deleted

## 2018-10-08 ENCOUNTER — Other Ambulatory Visit: Payer: Self-pay | Admitting: Oncology

## 2018-10-08 DIAGNOSIS — C61 Malignant neoplasm of prostate: Secondary | ICD-10-CM

## 2018-10-08 MED ORDER — ENZALUTAMIDE 40 MG PO CAPS: 160.0000 mg | ORAL_CAPSULE | Freq: Every day | ORAL | 0 refills | Status: DC

## 2018-10-30 NOTE — Telephone Encounter (Signed)
Oral Oncology Patient Advocate Encounter  I called [employer] support solutions to follow up on the status of the renewal application. [employer] has everything they need from us and the application is still in processing.  Tylicia Sherman [facility] Patient Advocate [facility] Phone [phone] Fax [phone]   

## 2018-11-06 ENCOUNTER — Inpatient Hospital Stay: Payer: Medicare Other | Attending: Oncology

## 2018-11-06 ENCOUNTER — Inpatient Hospital Stay (HOSPITAL_BASED_OUTPATIENT_CLINIC_OR_DEPARTMENT_OTHER): Payer: Medicare Other | Admitting: Oncology

## 2018-11-06 ENCOUNTER — Telehealth: Payer: Self-pay

## 2018-11-06 VITALS — BP 143/77 | HR 81 | Temp 98.3°F | Resp 18 | Ht 70.0 in | Wt 169.5 lb

## 2018-11-06 DIAGNOSIS — C61 Malignant neoplasm of prostate: Secondary | ICD-10-CM

## 2018-11-06 DIAGNOSIS — Z791 Long term (current) use of non-steroidal anti-inflammatories (NSAID): Secondary | ICD-10-CM | POA: Diagnosis not present

## 2018-11-06 DIAGNOSIS — R591 Generalized enlarged lymph nodes: Secondary | ICD-10-CM

## 2018-11-06 DIAGNOSIS — R5383 Other fatigue: Secondary | ICD-10-CM | POA: Diagnosis not present

## 2018-11-06 DIAGNOSIS — Z79899 Other long term (current) drug therapy: Secondary | ICD-10-CM

## 2018-11-06 LAB — CBC WITH DIFFERENTIAL (CANCER CENTER ONLY)
Abs Immature Granulocytes: 0.01 10*3/uL (ref 0.00–0.07)
BASOS ABS: 0 10*3/uL (ref 0.0–0.1)
Basophils Relative: 1 %
EOS PCT: 2 %
Eosinophils Absolute: 0.1 10*3/uL (ref 0.0–0.5)
HEMATOCRIT: 38.8 % — AB (ref 39.0–52.0)
HEMOGLOBIN: 13 g/dL (ref 13.0–17.0)
Immature Granulocytes: 0 %
LYMPHS ABS: 1.5 10*3/uL (ref 0.7–4.0)
LYMPHS PCT: 30 %
MCH: 32.3 pg (ref 26.0–34.0)
MCHC: 33.5 g/dL (ref 30.0–36.0)
MCV: 96.3 fL (ref 80.0–100.0)
Monocytes Absolute: 0.3 10*3/uL (ref 0.1–1.0)
Monocytes Relative: 7 %
NRBC: 0 % (ref 0.0–0.2)
Neutro Abs: 3 10*3/uL (ref 1.7–7.7)
Neutrophils Relative %: 60 %
Platelet Count: 234 10*3/uL (ref 150–400)
RBC: 4.03 MIL/uL — ABNORMAL LOW (ref 4.22–5.81)
RDW: 12.9 % (ref 11.5–15.5)
WBC Count: 5 10*3/uL (ref 4.0–10.5)

## 2018-11-06 LAB — CMP (CANCER CENTER ONLY)
ALBUMIN: 3.4 g/dL — AB (ref 3.5–5.0)
ALT: 19 U/L (ref 0–44)
AST: 14 U/L — AB (ref 15–41)
Alkaline Phosphatase: 47 U/L (ref 38–126)
Anion gap: 10 (ref 5–15)
BUN: 15 mg/dL (ref 8–23)
CO2: 22 mmol/L (ref 22–32)
Calcium: 9.3 mg/dL (ref 8.9–10.3)
Chloride: 105 mmol/L (ref 98–111)
Creatinine: 0.88 mg/dL (ref 0.61–1.24)
GFR, Est AFR Am: >60 mL/min (ref >60)
GFR, Estimated: >60 mL/min (ref >60)
Glucose, Bld: 137 mg/dL — ABNORMAL HIGH (ref 70–99)
POTASSIUM: 3.7 mmol/L (ref 3.5–5.1)
SODIUM: 137 mmol/L (ref 135–145)
Total Bilirubin: 0.2 mg/dL — ABNORMAL LOW (ref 0.3–1.2)
Total Protein: 6.5 g/dL (ref 6.5–8.1)

## 2018-11-06 NOTE — Telephone Encounter (Signed)
Printed avs and calender of upcoming appointmernt. Per 1/3 los

## 2018-11-06 NOTE — Progress Notes (Signed)
Hematology and Oncology Follow Up Visit  Phillip Brown [id number] 28-Feb-1951 68 y.o. 11/06/2018 2:53 PM System, Pcp Not InBell, [name], MD   Principle Diagnosis: 68 year old man with castration-sensitive prostate cancer with lymphadenopathy diagnosed in February 2019.  He presented with PSA was 118, Gleason score 4+5 = 9.    Prior Therapy:  Prostate biopsy in February 2019 which showed Gleason score 4+5 = 9 and 2 cores with 6 other cores of Gleason score 8.     Current therapy:  Lupron under the care of Dr. Gloriann Loan.  Xtandi 160 mg daily started October 2019.   Interim History: [name] is here for a repeat evaluation.  Since the last visit, he reports no major changes in his health.  He continues to have issues with fatigue and slight deconditioning which has not affected his quality of life dramatically.  He still able to attend to activities of daily living including driving and shopping.  He continues to tolerate Xtandi without any other side effects.  He denies any dysuria, bone pain or pathological fractures.  He does take a nap once a day but has been not exercising regularly like he has in the past.   He does not report any headaches, blurry vision, syncope or seizures.  Denies any alteration of mental status or dizziness.  Does not report any fevers, chills or sweats.  Does not report any cough, wheezing or hemoptysis.  Does not report any chest pain, palpitation, orthopnea or leg edema.  Does not report any nausea, vomiting or abdominal distention. Does not report any constipation or diarrhea. Does not report any bone pain or pathological fractures.   Does not report frequency, urgency or hematuria.  Does not report any ecchymosis or bruising. Does not report any bleeding or clotting tendency.  Does not report any anxiety or depression.  Remaining review of systems is negative.    Medications: I have reviewed the patient's current medications.  Current Outpatient  Medications  Medication Sig Dispense Refill  . acetaminophen (TYLENOL) 500 MG tablet Take 500-1,000 mg by mouth every 6 (six) hours as needed (for pain or headaches).    Marland Kitchen BLACK PEPPER-TURMERIC PO Take 1 tablet by mouth daily.    . enzalutamide (XTANDI) 40 MG capsule Take 4 capsules (160 mg total) by mouth daily. 120 capsule 0  . Leuprolide Acetate (LUPRON IJ) Inject 1 Dose as directed every 6 (six) months.    . Multiple Vitamin (MULTIVITAMIN WITH MINERALS) TABS tablet Take 2 tablets by mouth daily.    . Multiple Vitamin (MULTIVITAMIN) tablet Take 2 tablets by mouth 2 (two) times daily. "bone up" calcium enriched multivitamin    . Multiple Vitamins-Minerals (PRESERVISION AREDS) CAPS Take 1 capsule by mouth daily.    . naproxen (NAPROSYN) 500 MG tablet Take 1 tablet (500 mg total) by mouth 2 (two) times daily with a meal. (Patient taking differently: Take 500 mg by mouth 2 (two) times daily as needed. ) 40 tablet 1  . OVER THE COUNTER MEDICATION Take 1 tablet by mouth daily as needed (energy). "blood builder" iron complex    . oxyCODONE (OXY IR/ROXICODONE) 5 MG immediate release tablet Take 1-2 tablets (5-10 mg total) by mouth every 6 (six) hours as needed for moderate pain, severe pain or breakthrough pain. 30 tablet 0  . tamsulosin (FLOMAX) 0.4 MG CAPS capsule Take 1 capsule (0.4 mg total) by mouth daily. 30 capsule 0  . Tryptophan 500 MG TABS Take 500 mg by mouth at  bedtime as needed (sleep).     No current facility-administered medications for this visit.      Allergies: No Known Allergies  Past Medical History, Surgical history, Social history, and Family History were reviewed and updated.    Physical Exam: Blood pressure (!) 143/77, pulse 81, temperature 98.3 F (36.8 C), temperature source Oral, resp. rate 18, height 5\' 10"  (1.778 m), weight 169 lb 8 oz (76.9 kg), SpO2 97 %.    ECOG: 0   General appearance: Comfortable appearing without any discomfort Head: Normocephalic  without any trauma Oropharynx: Mucous membranes are moist and pink without any thrush or ulcers. Eyes: Pupils are equal and round reactive to light. Lymph nodes: No cervical, supraclavicular, inguinal or axillary lymphadenopathy.   Heart:regular rate and rhythm.  S1 and S2 without leg edema. Lung: Clear without any rhonchi or wheezes.  No dullness to percussion. Abdomin: Soft, nontender, nondistended with good bowel sounds.  No hepatosplenomegaly. Musculoskeletal: No joint deformity or effusion.  Full range of motion noted. Neurological: No deficits noted on motor, sensory and deep tendon reflex exam. Skin: No petechial rash or dryness.  Appeared moist.  Psychiatric: Mood and affect appeared appropriate.      Lab Results: Lab Results  Component Value Date   WBC 5.1 09/11/2018   HGB 12.9 (L) 09/11/2018   HCT 38.2 (L) 09/11/2018   MCV 96.0 09/11/2018   PLT 199 09/11/2018     Chemistry      Component Value Date/Time   NA 140 09/11/2018 1445   K 3.8 09/11/2018 1445   CL 104 09/11/2018 1445   CO2 26 09/11/2018 1445   BUN 11 09/11/2018 1445   CREATININE 0.85 09/11/2018 1445      Component Value Date/Time   CALCIUM 9.5 09/11/2018 1445   ALKPHOS 47 09/11/2018 1445   AST 20 09/11/2018 1445   ALT 23 09/11/2018 1445   BILITOT 0.3 09/11/2018 1445     Results for Phillip Brown (MRN [id number]) as of 11/06/2018 13:53  Ref. Range 08/06/2018 15:01 09/11/2018 14:45  Prostate Specific Ag, Serum Latest Ref Range: 0.0 - 4.0 ng/mL 1.4 0.5    Impression and Plan:  68 year old man with:  1.    Advanced prostate cancer with lymphadenopathy and bone disease that is currently castration-sensitive diagnosed in February 2019.  He continues to tolerate Xtandi without any recent complications.  His PSA in November 2019 was 0.5 she continues to show excellent response to therapy.  Risks and benefits of continuing this treatment was discussed today and he is agreeable to continue.  Dose reduction  have been suggested today given his increased fatigue but he opted to continue with the same dose and schedule.  2.  Androgen deprivation: He remains on long-term androgen deprivation therapy under the care of Dr. Gloriann Loan.  I recommended continuing this indefinitely.   3.  Fatigue and deconditioning: We had a long discussion today about ways to combat this particular issue.  I have recommended cardiovascular exercise on a regular basis with escalating intervals.  He understands that with androgen deprivation and Xtandi decrease in muscle mass could be associated with worsening deconditioning if not combated early.  We also have discussed dose reduction [name] was an option in the future.  After discussion today, he is more open to increase his cardiovascular exercise more regularly.  We will continue to monitor this issue in future visits.  4.  Prognosis and goals of care: He has incurable malignancy but disease that has been palliated  effectively.  Aggressive therapy is warranted at this time.   5.  Follow-up: Will be in 8 weeks.  25  minutes was spent with the patient face-to-face today.  More than 50% of time was dedicated to reviewing his disease status, laboratory data and addressing questions regarding therapy and complications.   Zola Button, MD 1/3/20202:53 PM

## 2018-11-07 LAB — PROSTATE-SPECIFIC AG, SERUM (LABCORP): Prostate Specific Ag, Serum: <0.1 ng/mL (ref 0.0–4.0)

## 2018-11-09 ENCOUNTER — Telehealth: Payer: Self-pay

## 2018-11-09 NOTE — Telephone Encounter (Signed)
-----   Message from Wyatt Portela, MD sent at 11/09/2018  9:09 AM EST ----- Please let him know his PSA is low

## 2018-11-09 NOTE — Telephone Encounter (Signed)
Contacted patient and made aware of PSA results. 

## 2018-11-27 NOTE — Telephone Encounter (Signed)
Oral Oncology Patient Advocate Encounter  [facility]ip [facility] support solutions has approved his Xtandi to be filled with them through 11/04/19.  I called the patient and gave him the good news, he verbalized understanding and great appreciation.  [facility] [place] Phone [phone] Fax [phone]

## 2018-12-31 ENCOUNTER — Other Ambulatory Visit: Payer: Self-pay | Admitting: *Deleted

## 2018-12-31 DIAGNOSIS — C61 Malignant neoplasm of prostate: Secondary | ICD-10-CM

## 2018-12-31 MED ORDER — ENZALUTAMIDE 40 MG PO CAPS: 160.0000 mg | ORAL_CAPSULE | Freq: Every day | ORAL | 0 refills | Status: DC

## 2019-01-06 ENCOUNTER — Inpatient Hospital Stay: Payer: Medicare Other

## 2019-01-06 ENCOUNTER — Telehealth: Payer: Self-pay | Admitting: Oncology

## 2019-01-06 ENCOUNTER — Inpatient Hospital Stay: Payer: Medicare Other | Attending: Oncology | Admitting: Oncology

## 2019-01-06 VITALS — BP 150/73 | HR 70 | Temp 98.8°F | Resp 17 | Ht 70.0 in | Wt 168.5 lb

## 2019-01-06 DIAGNOSIS — I10 Essential (primary) hypertension: Secondary | ICD-10-CM | POA: Diagnosis not present

## 2019-01-06 DIAGNOSIS — Z79899 Other long term (current) drug therapy: Secondary | ICD-10-CM | POA: Insufficient documentation

## 2019-01-06 DIAGNOSIS — C61 Malignant neoplasm of prostate: Secondary | ICD-10-CM | POA: Insufficient documentation

## 2019-01-06 DIAGNOSIS — R5383 Other fatigue: Secondary | ICD-10-CM | POA: Insufficient documentation

## 2019-01-06 DIAGNOSIS — Z79818 Long term (current) use of other agents affecting estrogen receptors and estrogen levels: Secondary | ICD-10-CM | POA: Insufficient documentation

## 2019-01-06 LAB — CBC WITH DIFFERENTIAL (CANCER CENTER ONLY)
Abs Immature Granulocytes: 0.01 10*3/uL (ref 0.00–0.07)
Basophils Absolute: 0 10*3/uL (ref 0.0–0.1)
Basophils Relative: 1 %
Eosinophils Absolute: 0.2 10*3/uL (ref 0.0–0.5)
Eosinophils Relative: 3 %
HCT: 36.6 % — ABNORMAL LOW (ref 39.0–52.0)
Hemoglobin: 12.3 g/dL — ABNORMAL LOW (ref 13.0–17.0)
IMMATURE GRANULOCYTES: 0 %
Lymphocytes Relative: 30 %
Lymphs Abs: 1.8 10*3/uL (ref 0.7–4.0)
MCH: 33.1 pg (ref 26.0–34.0)
MCHC: 33.6 g/dL (ref 30.0–36.0)
MCV: 98.4 fL (ref 80.0–100.0)
Monocytes Absolute: 0.4 10*3/uL (ref 0.1–1.0)
Monocytes Relative: 7 %
Neutro Abs: 3.5 10*3/uL (ref 1.7–7.7)
Neutrophils Relative %: 59 %
Platelet Count: 255 10*3/uL (ref 150–400)
RBC: 3.72 MIL/uL — ABNORMAL LOW (ref 4.22–5.81)
RDW: 12.5 % (ref 11.5–15.5)
WBC Count: 5.9 10*3/uL (ref 4.0–10.5)
nRBC: 0 % (ref 0.0–0.2)

## 2019-01-06 LAB — COMPREHENSIVE METABOLIC PANEL
ALK PHOS: 46 U/L (ref 38–126)
ALT: 17 U/L (ref 0–44)
AST: 17 U/L (ref 15–41)
Albumin: 3.9 g/dL (ref 3.5–5.0)
Anion gap: 7 (ref 5–15)
BUN: 14 mg/dL (ref 8–23)
CO2: 26 mmol/L (ref 22–32)
CREATININE: 0.81 mg/dL (ref 0.61–1.24)
Calcium: 9.3 mg/dL (ref 8.9–10.3)
Chloride: 104 mmol/L (ref 98–111)
GFR calc Af Amer: >60 mL/min (ref >60)
GFR calc non Af Amer: >60 mL/min (ref >60)
Glucose, Bld: 120 mg/dL — ABNORMAL HIGH (ref 70–99)
Potassium: 3.7 mmol/L (ref 3.5–5.1)
Sodium: 137 mmol/L (ref 135–145)
Total Bilirubin: 0.5 mg/dL (ref 0.3–1.2)
Total Protein: 6.1 g/dL — ABNORMAL LOW (ref 6.5–8.1)

## 2019-01-06 NOTE — Telephone Encounter (Signed)
Gave avs and calendar ° °

## 2019-01-06 NOTE — Progress Notes (Signed)
Hematology and Oncology Follow Up Visit  Phillip Brown [id number] 1951-09-27 68 y.o. 01/06/2019 2:36 PM System, Pcp Not InBell, [name], MD   Principle Diagnosis: 68 year old man with advanced prostate cancer with lymphadenopathy presented with PSA 118 and a Gleason score 4+5 = 9 in February 2019.  He has castration-sensitive at this time.   Prior Therapy:  Prostate biopsy in February 2019 which showed Gleason score 4+5 = 9 and 2 cores with 6 other cores of Gleason score 8.     Current therapy:  Lupron under the care of Dr. Gloriann Loan.  Xtandi 160 mg daily started October 2019.   Interim History: [name] returns today for a follow-up.  Since last visit, he reports no major changes in his health.  Continues to tolerate Xtandi without any recent complaints.  Denies any worsening edema, excessive fatigue or tiredness.  His appetite and quality of life remains unchanged.  He is joined a gym recently and has been exercising periodically.  His performance status and quality of life remains unchanged.   Patient denied any alteration mental status, neuropathy, confusion or dizziness.  Denies any headaches or lethargy.  Denies any night sweats, weight loss or changes in appetite.  Denied orthopnea, dyspnea on exertion or chest discomfort.  Denies shortness of breath, difficulty breathing hemoptysis or cough.  Denies any abdominal distention, nausea, early satiety or dyspepsia.  Denies any hematuria, frequency, dysuria or nocturia.  Denies any skin irritation, dryness or rash.  Denies any ecchymosis or petechiae.  Denies any lymphadenopathy or clotting.  Denies any heat or cold intolerance.  Denies any anxiety or depression.  Remaining review of system is negative.    Medications: I have reviewed the patient's current medications.  Current Outpatient Medications  Medication Sig Dispense Refill  . acetaminophen (TYLENOL) 500 MG tablet Take 500-1,000 mg by mouth every 6 (six) hours as needed  (for pain or headaches).    Marland Kitchen BLACK PEPPER-TURMERIC PO Take 1 tablet by mouth daily.    . enzalutamide (XTANDI) 40 MG capsule Take 4 capsules (160 mg total) by mouth daily. 120 capsule 0  . Leuprolide Acetate (LUPRON IJ) Inject 1 Dose as directed every 6 (six) months.    . Multiple Vitamin (MULTIVITAMIN WITH MINERALS) TABS tablet Take 2 tablets by mouth daily.    . Multiple Vitamin (MULTIVITAMIN) tablet Take 2 tablets by mouth 2 (two) times daily. "bone up" calcium enriched multivitamin    . Multiple Vitamins-Minerals (PRESERVISION AREDS) CAPS Take 1 capsule by mouth daily.    . naproxen (NAPROSYN) 500 MG tablet Take 1 tablet (500 mg total) by mouth 2 (two) times daily with a meal. (Patient taking differently: Take 500 mg by mouth 2 (two) times daily as needed. ) 40 tablet 1  . OVER THE COUNTER MEDICATION Take 1 tablet by mouth daily as needed (energy). "blood builder" iron complex    . oxyCODONE (OXY IR/ROXICODONE) 5 MG immediate release tablet Take 1-2 tablets (5-10 mg total) by mouth every 6 (six) hours as needed for moderate pain, severe pain or breakthrough pain. 30 tablet 0  . tamsulosin (FLOMAX) 0.4 MG CAPS capsule Take 1 capsule (0.4 mg total) by mouth daily. 30 capsule 0  . Tryptophan 500 MG TABS Take 500 mg by mouth at bedtime as needed (sleep).     No current facility-administered medications for this visit.      Allergies: No Known Allergies  Past Medical History, Surgical history, Social history, and Family History were reviewed and  updated.    Physical Exam:  Blood pressure (!) 150/73, pulse 70, temperature 98.8 F (37.1 C), temperature source Oral, resp. rate 17, height 5\' 10"  (1.778 m), weight 168 lb 8 oz (76.4 kg), SpO2 100 %.    ECOG: 0   General appearance: Alert, awake without any distress. Head: Atraumatic without abnormalities Oropharynx: Without any thrush or ulcers. Eyes: No scleral icterus. Lymph nodes: No lymphadenopathy noted in the cervical,  supraclavicular, or axillary nodes Heart:regular rate and rhythm, without any murmurs or gallops.   Lung: Clear to auscultation without any rhonchi, wheezes or dullness to percussion. Abdomin: Soft, nontender without any shifting dullness or ascites. Musculoskeletal: No clubbing or cyanosis. Neurological: No motor or sensory deficits. Skin: No rashes or lesions.       Lab Results: Lab Results  Component Value Date   WBC 5.0 11/06/2018   HGB 13.0 11/06/2018   HCT 38.8 (L) 11/06/2018   MCV 96.3 11/06/2018   PLT 234 11/06/2018     Chemistry      Component Value Date/Time   NA 137 11/06/2018 1445   K 3.7 11/06/2018 1445   CL 105 11/06/2018 1445   CO2 22 11/06/2018 1445   BUN 15 11/06/2018 1445   CREATININE 0.88 11/06/2018 1445      Component Value Date/Time   CALCIUM 9.3 11/06/2018 1445   ALKPHOS 47 11/06/2018 1445   AST 14 (L) 11/06/2018 1445   ALT 19 11/06/2018 1445   BILITOT 0.2 (L) 11/06/2018 1445     Results for SAIQUAN, HANDS (MRN [id number]) as of 01/06/2019 14:16  Ref. Range 09/11/2018 14:45 11/06/2018 14:45  Prostate Specific Ag, Serum Latest Ref Range: 0.0 - 4.0 ng/mL 0.5 <0.1      Impression and Plan:  68 year old man with:  1. Castration-sensitive diagnosed in February 2019 after presenting with lymphadenopathy.  He remains on Xtandi with excellent PSA response that is currently undetectable.  Risks and benefits of continuing this therapy long-term was reviewed today.  Complication associated with this therapy was reiterated including hypertension and excessive fatigue.  Dose reduction was also discussed again today.  He is agreeable to continue at this time.  He understands that additional therapy may be needed if he develops castration-resistant disease.  These would include systemic chemotherapy, Zytiga among others.  2.  Androgen deprivation: Currently receiving it under the care of Dr. Gloriann Loan.  I recommended continuing this indefinitely.  3.  Fatigue  and deconditioning: I recommended continuing exercise as he is done previously.  He appears to have benefited from cardiovascular exercise.  4.  Prognosis and goals of care: Therapy remains palliative although aggressive therapy is warranted given his excellent performance status.  5.  High blood pressure: This will continue to be monitored on Xtandi and periodic blood pressure monitoring is recommended.  6  Follow-up: Will be in May 2020 for repeat evaluation.   15  minutes was spent with the patient face-to-face today.  More than 50% of time was dedicated to discussing the natural course of his disease, complication related to therapy and alternative treatment traces in the future.  Zola Button, MD 3/4/20202:36 PM

## 2019-01-07 ENCOUNTER — Telehealth: Payer: Self-pay

## 2019-01-07 LAB — PROSTATE-SPECIFIC AG, SERUM (LABCORP): Prostate Specific Ag, Serum: <0.1 ng/mL (ref 0.0–4.0)

## 2019-01-07 NOTE — Telephone Encounter (Signed)
-----   Message from Wyatt Portela, MD sent at 01/07/2019  9:24 AM EST ----- Please let him know his PSA is still low.

## 2019-01-07 NOTE — Telephone Encounter (Signed)
Contacted patient and made aware of PSA results. 

## 2019-01-27 ENCOUNTER — Other Ambulatory Visit: Payer: Self-pay | Admitting: *Deleted

## 2019-01-27 DIAGNOSIS — C61 Malignant neoplasm of prostate: Secondary | ICD-10-CM

## 2019-01-27 MED ORDER — ENZALUTAMIDE 40 MG PO CAPS: 160.0000 mg | ORAL_CAPSULE | Freq: Every day | ORAL | 0 refills | Status: DC

## 2019-02-04 ENCOUNTER — Other Ambulatory Visit: Payer: Self-pay

## 2019-02-04 DIAGNOSIS — C61 Malignant neoplasm of prostate: Secondary | ICD-10-CM

## 2019-02-04 MED ORDER — ENZALUTAMIDE 40 MG PO CAPS: 160.0000 mg | ORAL_CAPSULE | Freq: Every day | ORAL | 0 refills | Status: DC

## 2019-02-23 DIAGNOSIS — C61 Malignant neoplasm of prostate: Secondary | ICD-10-CM | POA: Diagnosis not present

## 2019-03-02 DIAGNOSIS — C61 Malignant neoplasm of prostate: Secondary | ICD-10-CM | POA: Diagnosis not present

## 2019-03-02 DIAGNOSIS — Z5111 Encounter for antineoplastic chemotherapy: Secondary | ICD-10-CM | POA: Diagnosis not present

## 2019-03-02 DIAGNOSIS — C775 Secondary and unspecified malignant neoplasm of intrapelvic lymph nodes: Secondary | ICD-10-CM | POA: Diagnosis not present

## 2019-03-10 ENCOUNTER — Other Ambulatory Visit: Payer: Self-pay

## 2019-03-10 DIAGNOSIS — C61 Malignant neoplasm of prostate: Secondary | ICD-10-CM

## 2019-03-10 MED ORDER — ENZALUTAMIDE 40 MG PO CAPS: 160.0000 mg | ORAL_CAPSULE | Freq: Every day | ORAL | 0 refills | Status: DC

## 2019-03-18 ENCOUNTER — Inpatient Hospital Stay: Payer: Medicare Other

## 2019-03-18 ENCOUNTER — Other Ambulatory Visit: Payer: Self-pay

## 2019-03-18 ENCOUNTER — Inpatient Hospital Stay: Payer: Medicare Other | Attending: Oncology | Admitting: Oncology

## 2019-03-18 VITALS — BP 140/70 | HR 81 | Temp 99.1°F | Resp 18 | Ht 70.0 in | Wt 170.2 lb

## 2019-03-18 DIAGNOSIS — Z79899 Other long term (current) drug therapy: Secondary | ICD-10-CM | POA: Diagnosis not present

## 2019-03-18 DIAGNOSIS — C61 Malignant neoplasm of prostate: Secondary | ICD-10-CM

## 2019-03-18 DIAGNOSIS — I10 Essential (primary) hypertension: Secondary | ICD-10-CM | POA: Diagnosis not present

## 2019-03-18 DIAGNOSIS — R5383 Other fatigue: Secondary | ICD-10-CM | POA: Diagnosis not present

## 2019-03-18 LAB — CMP (CANCER CENTER ONLY)
ALT: 16 U/L (ref 0–44)
AST: 16 U/L (ref 15–41)
Albumin: 3.5 g/dL (ref 3.5–5.0)
Alkaline Phosphatase: 51 U/L (ref 38–126)
Anion gap: 7 (ref 5–15)
BUN: 13 mg/dL (ref 8–23)
CO2: 27 mmol/L (ref 22–32)
Calcium: 9.1 mg/dL (ref 8.9–10.3)
Chloride: 103 mmol/L (ref 98–111)
Creatinine: 0.83 mg/dL (ref 0.61–1.24)
GFR, Est AFR Am: >60 mL/min (ref >60)
GFR, Estimated: >60 mL/min (ref >60)
Glucose, Bld: 123 mg/dL — ABNORMAL HIGH (ref 70–99)
Potassium: 3.6 mmol/L (ref 3.5–5.1)
Sodium: 137 mmol/L (ref 135–145)
Total Bilirubin: 0.2 mg/dL — ABNORMAL LOW (ref 0.3–1.2)
Total Protein: 6.2 g/dL — ABNORMAL LOW (ref 6.5–8.1)

## 2019-03-18 LAB — CBC WITH DIFFERENTIAL (CANCER CENTER ONLY)
Abs Immature Granulocytes: 0 10*3/uL (ref 0.00–0.07)
Basophils Absolute: 0 10*3/uL (ref 0.0–0.1)
Basophils Relative: 0 %
Eosinophils Absolute: 0.1 10*3/uL (ref 0.0–0.5)
Eosinophils Relative: 2 %
HCT: 37 % — ABNORMAL LOW (ref 39.0–52.0)
Hemoglobin: 12.5 g/dL — ABNORMAL LOW (ref 13.0–17.0)
Immature Granulocytes: 0 %
Lymphocytes Relative: 33 %
Lymphs Abs: 1.7 10*3/uL (ref 0.7–4.0)
MCH: 33.2 pg (ref 26.0–34.0)
MCHC: 33.8 g/dL (ref 30.0–36.0)
MCV: 98.4 fL (ref 80.0–100.0)
Monocytes Absolute: 0.4 10*3/uL (ref 0.1–1.0)
Monocytes Relative: 7 %
Neutro Abs: 2.9 10*3/uL (ref 1.7–7.7)
Neutrophils Relative %: 58 %
Platelet Count: 222 10*3/uL (ref 150–400)
RBC: 3.76 MIL/uL — ABNORMAL LOW (ref 4.22–5.81)
RDW: 12.5 % (ref 11.5–15.5)
WBC Count: 5.1 10*3/uL (ref 4.0–10.5)
nRBC: 0 % (ref 0.0–0.2)

## 2019-03-18 NOTE — Progress Notes (Signed)
Hematology and Oncology Follow Up Visit  Phillip Brown [id number] [birth year] 68 y.o. 03/18/2019 2:54 PM System, Pcp Not InBell, [name], MD   Principle Diagnosis: 69 year old man with castration-sensitive prostate cancer with lymphadenopathy diagnosed in February 2019.  He was found to have PSA 118 and a Gleason score 4+5 = 9 in February 2019.    Prior Therapy:  Prostate biopsy in February 2019 which showed Gleason score 4+5 = 9 and 2 cores with 6 other cores of Gleason score 8.     Current therapy:  Androgen deprivation therapy started under the care of Dr. Gloriann Loan for which she receives Lupron periodically.  Xtandi 160 mg daily started October 2019.   Interim History: Phillip Brown is here for a repeat evaluation.  Since the last visit, he reports no recent issues or complaints.  He continues to tolerate Xtandi without any new complications.  He does report some mild fatigue but is manageable at this time.  He denies any bone pain or pathological fractures.  He denies any recent hospitalizations or illnesses.  Continues to exercise regularly predominantly walking and jogging outside.  He denied headaches, blurry vision, syncope or seizures.  Denies any fevers, chills or sweats.  Denied chest pain, palpitation, orthopnea or leg edema.  Denied cough, wheezing or hemoptysis.  Denied nausea, vomiting or abdominal pain.  Denies any constipation or diarrhea.  Denies any frequency urgency or hesitancy.  Denies any arthralgias or myalgias.  Denies any skin rashes or lesions.  Denies any bleeding or clotting tendency.  Denies any easy bruising.  Denies any hair or nail changes.  Denies any anxiety or depression.  Remaining review of system is negative.     Medications: I have reviewed the patient's current medications.  Current Outpatient Medications  Medication Sig Dispense Refill  . acetaminophen (TYLENOL) 500 MG tablet Take 500-1,000 mg by mouth every 6 (six) hours as needed (for pain or  headaches).    Marland Kitchen BLACK PEPPER-TURMERIC PO Take 1 tablet by mouth daily.    . enzalutamide (XTANDI) 40 MG capsule Take 4 capsules (160 mg total) by mouth daily. 120 capsule 0  . Leuprolide Acetate (LUPRON IJ) Inject 1 Dose as directed every 6 (six) months.    . Multiple Vitamin (MULTIVITAMIN WITH MINERALS) TABS tablet Take 2 tablets by mouth daily.    . Multiple Vitamin (MULTIVITAMIN) tablet Take 2 tablets by mouth 2 (two) times daily. "bone up" calcium enriched multivitamin    . Multiple Vitamins-Minerals (PRESERVISION AREDS) CAPS Take 1 capsule by mouth daily.    . naproxen (NAPROSYN) 500 MG tablet Take 1 tablet (500 mg total) by mouth 2 (two) times daily with a meal. (Patient taking differently: Take 500 mg by mouth 2 (two) times daily as needed. ) 40 tablet 1  . OVER THE COUNTER MEDICATION Take 1 tablet by mouth daily as needed (energy). "blood builder" iron complex    . oxyCODONE (OXY IR/ROXICODONE) 5 MG immediate release tablet Take 1-2 tablets (5-10 mg total) by mouth every 6 (six) hours as needed for moderate pain, severe pain or breakthrough pain. 30 tablet 0  . tamsulosin (FLOMAX) 0.4 MG CAPS capsule Take 1 capsule (0.4 mg total) by mouth daily. 30 capsule 0  . Tryptophan 500 MG TABS Take 500 mg by mouth at bedtime as needed (sleep).     No current facility-administered medications for this visit.      Allergies: No Known Allergies  Past Medical History, Surgical history, Social history, and Family  History were reviewed and updated.    Physical Exam:  Blood pressure 140/70, pulse 81, temperature 99.1 F (37.3 C), temperature source Oral, resp. rate 18, height 5\' 10"  (1.778 m), weight 170 lb 3.2 oz (77.2 kg), SpO2 98 %.     ECOG: 0    General appearance: Comfortable appearing without any discomfort Head: Normocephalic without any trauma Oropharynx: Mucous membranes are moist and pink without any thrush or ulcers. Eyes: Pupils are equal and round reactive to light. Lymph  nodes: No cervical, supraclavicular, inguinal or axillary lymphadenopathy.   Heart:regular rate and rhythm.  S1 and S2 without leg edema. Lung: Clear without any rhonchi or wheezes.  No dullness to percussion. Abdomin: Soft, nontender, nondistended with good bowel sounds.  No hepatosplenomegaly. Musculoskeletal: No joint deformity or effusion.  Full range of motion noted. Neurological: No deficits noted on motor, sensory and deep tendon reflex exam. Skin: No petechial rash or dryness.  Appeared moist.         Lab Results: Lab Results  Component Value Date   WBC 5.9 01/06/2019   HGB 12.3 (L) 01/06/2019   HCT 36.6 (L) 01/06/2019   MCV 98.4 01/06/2019   PLT 255 01/06/2019     Chemistry      Component Value Date/Time   NA 137 01/06/2019 1432   K 3.7 01/06/2019 1432   CL 104 01/06/2019 1432   CO2 26 01/06/2019 1432   BUN 14 01/06/2019 1432   CREATININE 0.81 01/06/2019 1432   CREATININE 0.88 11/06/2018 1445      Component Value Date/Time   CALCIUM 9.3 01/06/2019 1432   ALKPHOS 46 01/06/2019 1432   AST 17 01/06/2019 1432   AST 14 (L) 11/06/2018 1445   ALT 17 01/06/2019 1432   ALT 19 11/06/2018 1445   BILITOT 0.5 01/06/2019 1432   BILITOT 0.2 (L) 11/06/2018 1445       Results for Phillip Brown, Phillip Brown (MRN [id number]) as of 03/18/2019 14:50  Ref. Range 11/06/2018 14:45 01/06/2019 14:27  Prostate Specific Ag, Serum Latest Ref Range: 0.0 - 4.0 ng/mL <0.1 <0.1     Impression and Plan:  68 year old man with:  1.  Advanced prostate cancer with lymphadenopathy diagnosed in February 2019.  He has castration-sensitive disease at this time.  He has tolerated Xtandi without any major complications with excellent PSA response that is currently undetectable.  Risks and benefits of continuing this treatments long-term was discussed.  Potential complications such as hypertension, hematuria and rarely seizures were reiterated.  Targeted therapy were also reviewed he developed castration  resistant disease.  2.  Androgen deprivation: I recommended continuing Lupron indefinitely.  He is receiving that under the care of Dr. Gloriann Loan.  3.  Fatigue and deconditioning: Improved with cardiovascular exercise which she has been doing regularly.  4.  Prognosis and goals of care: His disease is incurable and aggressive therapy is warranted given his excellent performance status.  5.  Hypertension: His blood pressure continues to be close to normal range and will be monitored periodically.  He understands that [name] can cause increase in his blood pressure and I recommended he continues to monitor it at home.  6  Follow-up: We will be in 3 months for repeat evaluation.  25  minutes was spent with the patient face-to-face today.  More than 50% of time was spent on reviewing his disease status, laboratory data, discussing treatment options and nature plan of care.  Zola Button, MD 5/14/20202:54 PM

## 2019-03-19 ENCOUNTER — Telehealth: Payer: Self-pay

## 2019-03-19 LAB — PROSTATE-SPECIFIC AG, SERUM (LABCORP): Prostate Specific Ag, Serum: <0.1 ng/mL (ref 0.0–4.0)

## 2019-03-19 NOTE — Telephone Encounter (Signed)
-----   Message from Wyatt Portela, MD sent at 03/19/2019  8:05 AM EDT ----- Please let him know his PSA is still very low.

## 2019-03-19 NOTE — Telephone Encounter (Signed)
Contacted patient and made aware of PSA results. 

## 2019-03-22 ENCOUNTER — Telehealth: Payer: Self-pay | Admitting: Oncology

## 2019-03-22 NOTE — Telephone Encounter (Signed)
Called regarding schedule °

## 2019-03-31 ENCOUNTER — Other Ambulatory Visit: Payer: Self-pay

## 2019-03-31 DIAGNOSIS — C61 Malignant neoplasm of prostate: Secondary | ICD-10-CM

## 2019-03-31 MED ORDER — ENZALUTAMIDE 40 MG PO CAPS: 160.0000 mg | ORAL_CAPSULE | Freq: Every day | ORAL | 0 refills | Status: DC

## 2019-04-26 ENCOUNTER — Other Ambulatory Visit: Payer: Self-pay | Admitting: *Deleted

## 2019-04-26 DIAGNOSIS — C61 Malignant neoplasm of prostate: Secondary | ICD-10-CM

## 2019-04-26 MED ORDER — XTANDI 40 MG PO CAPS: 160.0000 mg | ORAL_CAPSULE | Freq: Every day | ORAL | 0 refills | Status: DC

## 2019-05-25 ENCOUNTER — Telehealth: Payer: Self-pay | Admitting: Oncology

## 2019-05-25 NOTE — Telephone Encounter (Signed)
Adjusted 8/14 lab time. Confirmed with patient.

## 2019-05-26 ENCOUNTER — Other Ambulatory Visit: Payer: Self-pay

## 2019-05-26 DIAGNOSIS — C61 Malignant neoplasm of prostate: Secondary | ICD-10-CM

## 2019-05-26 MED ORDER — XTANDI 40 MG PO CAPS: 160.0000 mg | ORAL_CAPSULE | Freq: Every day | ORAL | 0 refills | Status: DC

## 2019-06-05 DIAGNOSIS — M25571 Pain in right ankle and joints of right foot: Secondary | ICD-10-CM | POA: Diagnosis not present

## 2019-06-05 DIAGNOSIS — S93401A Sprain of unspecified ligament of right ankle, initial encounter: Secondary | ICD-10-CM | POA: Diagnosis not present

## 2019-06-05 DIAGNOSIS — S93601A Unspecified sprain of right foot, initial encounter: Secondary | ICD-10-CM | POA: Diagnosis not present

## 2019-06-05 DIAGNOSIS — M19071 Primary osteoarthritis, right ankle and foot: Secondary | ICD-10-CM | POA: Diagnosis not present

## 2019-06-10 ENCOUNTER — Telehealth: Payer: Self-pay | Admitting: Oncology

## 2019-06-10 NOTE — Telephone Encounter (Signed)
Called patient regarding rescheduled appointments on 08/14 moved to 08/21, patient is notified.

## 2019-06-18 ENCOUNTER — Ambulatory Visit: Payer: Medicare Other | Admitting: Oncology

## 2019-06-18 ENCOUNTER — Other Ambulatory Visit: Payer: Medicare Other

## 2019-06-24 ENCOUNTER — Other Ambulatory Visit: Payer: Self-pay

## 2019-06-24 DIAGNOSIS — C61 Malignant neoplasm of prostate: Secondary | ICD-10-CM

## 2019-06-24 MED ORDER — XTANDI 40 MG PO CAPS: 160.0000 mg | ORAL_CAPSULE | Freq: Every day | ORAL | 0 refills | Status: DC

## 2019-06-25 ENCOUNTER — Inpatient Hospital Stay (HOSPITAL_BASED_OUTPATIENT_CLINIC_OR_DEPARTMENT_OTHER): Payer: Medicare Other | Admitting: Oncology

## 2019-06-25 ENCOUNTER — Inpatient Hospital Stay: Payer: Medicare Other | Attending: Oncology

## 2019-06-25 ENCOUNTER — Other Ambulatory Visit: Payer: Self-pay

## 2019-06-25 VITALS — BP 157/69 | HR 77 | Temp 98.5°F | Resp 20 | Ht 70.0 in | Wt 169.1 lb

## 2019-06-25 DIAGNOSIS — C61 Malignant neoplasm of prostate: Secondary | ICD-10-CM | POA: Diagnosis not present

## 2019-06-25 DIAGNOSIS — Z79818 Long term (current) use of other agents affecting estrogen receptors and estrogen levels: Secondary | ICD-10-CM | POA: Insufficient documentation

## 2019-06-25 DIAGNOSIS — Z791 Long term (current) use of non-steroidal anti-inflammatories (NSAID): Secondary | ICD-10-CM | POA: Insufficient documentation

## 2019-06-25 DIAGNOSIS — I10 Essential (primary) hypertension: Secondary | ICD-10-CM | POA: Insufficient documentation

## 2019-06-25 DIAGNOSIS — Z79899 Other long term (current) drug therapy: Secondary | ICD-10-CM | POA: Diagnosis not present

## 2019-06-25 LAB — CBC WITH DIFFERENTIAL (CANCER CENTER ONLY)
Abs Immature Granulocytes: 0.01 10*3/uL (ref 0.00–0.07)
Basophils Absolute: 0 10*3/uL (ref 0.0–0.1)
Basophils Relative: 1 %
Eosinophils Absolute: 0.2 10*3/uL (ref 0.0–0.5)
Eosinophils Relative: 3 %
HCT: 37 % — ABNORMAL LOW (ref 39.0–52.0)
Hemoglobin: 12.6 g/dL — ABNORMAL LOW (ref 13.0–17.0)
Immature Granulocytes: 0 %
Lymphocytes Relative: 35 %
Lymphs Abs: 2.1 10*3/uL (ref 0.7–4.0)
MCH: 34 pg (ref 26.0–34.0)
MCHC: 34.1 g/dL (ref 30.0–36.0)
MCV: 99.7 fL (ref 80.0–100.0)
Monocytes Absolute: 0.5 10*3/uL (ref 0.1–1.0)
Monocytes Relative: 8 %
Neutro Abs: 3.1 10*3/uL (ref 1.7–7.7)
Neutrophils Relative %: 53 %
Platelet Count: 253 10*3/uL (ref 150–400)
RBC: 3.71 MIL/uL — ABNORMAL LOW (ref 4.22–5.81)
RDW: 12 % (ref 11.5–15.5)
WBC Count: 5.9 10*3/uL (ref 4.0–10.5)
nRBC: 0 % (ref 0.0–0.2)

## 2019-06-25 LAB — CMP (CANCER CENTER ONLY)
ALT: 12 U/L (ref 0–44)
AST: 13 U/L — ABNORMAL LOW (ref 15–41)
Albumin: 3.6 g/dL (ref 3.5–5.0)
Alkaline Phosphatase: 72 U/L (ref 38–126)
Anion gap: 10 (ref 5–15)
BUN: 17 mg/dL (ref 8–23)
CO2: 25 mmol/L (ref 22–32)
Calcium: 9.3 mg/dL (ref 8.9–10.3)
Chloride: 103 mmol/L (ref 98–111)
Creatinine: 0.8 mg/dL (ref 0.61–1.24)
GFR, Est AFR Am: >60 mL/min (ref >60)
GFR, Estimated: >60 mL/min (ref >60)
Glucose, Bld: 107 mg/dL — ABNORMAL HIGH (ref 70–99)
Potassium: 3.9 mmol/L (ref 3.5–5.1)
Sodium: 138 mmol/L (ref 135–145)
Total Bilirubin: 0.2 mg/dL — ABNORMAL LOW (ref 0.3–1.2)
Total Protein: 6.7 g/dL (ref 6.5–8.1)

## 2019-06-25 NOTE — Progress Notes (Signed)
Hematology and Oncology Follow Up Visit  MACIAH RAMPONE [id number] [birth year] 68 y.o. 06/25/2019 3:44 PM System, Pcp Not InBell, [name], MD   Principle Diagnosis: 68 year old man with advanced prostate cancer with lymphadenopathy diagnosed in February 2019.  He presented with castration-sensitive and PSA 118 with Gleason score 4+5 = 9.    Prior Therapy:  Prostate biopsy in February 2019 which showed Gleason score 4+5 = 9 and 2 cores with 6 other cores of Gleason score 8.     Current therapy:  Androgen deprivation therapy started under the care of Dr. Gloriann Loan for which she receives Lupron periodically.  Xtandi 160 mg daily started October 2019.   Interim History: [name] is here for a follow-up visit.  Since the last visit, he has been active and continues to exercise regularly but did sustain an ankle injury while he was exercising.  He had an ankle sprain without any fractures or dislocations.  He continues to tolerate Xtandi without any major complications at this time.  He denies any nausea, fatigue or bone pain.  He denies any pathological fractures.  He denies any recent hospitalization or illnesses.   Patient denied any alteration mental status, neuropathy, confusion or dizziness.  Denies any headaches or lethargy.  Denies any night sweats, weight loss or changes in appetite.  Denied orthopnea, dyspnea on exertion or chest discomfort.  Denies shortness of breath, difficulty breathing hemoptysis or cough.  Denies any abdominal distention, nausea, early satiety or dyspepsia.  Denies any hematuria, frequency, dysuria or nocturia.  Denies any skin irritation, dryness or rash.  Denies any ecchymosis or petechiae.  Denies any lymphadenopathy or clotting.  Denies any heat or cold intolerance.  Denies any anxiety or depression.  Remaining review of system is negative.         Medications: Updated on review. Current Outpatient Medications  Medication Sig Dispense Refill  .  acetaminophen (TYLENOL) 500 MG tablet Take 500-1,000 mg by mouth every 6 (six) hours as needed (for pain or headaches).    Marland Kitchen BLACK PEPPER-TURMERIC PO Take 1 tablet by mouth daily.    . enzalutamide (XTANDI) 40 MG capsule Take 4 capsules (160 mg total) by mouth daily. 120 capsule 0  . Leuprolide Acetate (LUPRON IJ) Inject 1 Dose as directed every 6 (six) months.    . Multiple Vitamin (MULTIVITAMIN WITH MINERALS) TABS tablet Take 2 tablets by mouth daily.    . Multiple Vitamin (MULTIVITAMIN) tablet Take 2 tablets by mouth 2 (two) times daily. "bone up" calcium enriched multivitamin    . Multiple Vitamins-Minerals (PRESERVISION AREDS) CAPS Take 1 capsule by mouth daily.    . naproxen (NAPROSYN) 500 MG tablet Take 1 tablet (500 mg total) by mouth 2 (two) times daily with a meal. (Patient taking differently: Take 500 mg by mouth 2 (two) times daily as needed. ) 40 tablet 1  . OVER THE COUNTER MEDICATION Take 1 tablet by mouth daily as needed (energy). "blood builder" iron complex    . oxyCODONE (OXY IR/ROXICODONE) 5 MG immediate release tablet Take 1-2 tablets (5-10 mg total) by mouth every 6 (six) hours as needed for moderate pain, severe pain or breakthrough pain. 30 tablet 0  . tamsulosin (FLOMAX) 0.4 MG CAPS capsule Take 1 capsule (0.4 mg total) by mouth daily. 30 capsule 0  . Tryptophan 500 MG TABS Take 500 mg by mouth at bedtime as needed (sleep).     No current facility-administered medications for this visit.  Allergies: No Known Allergies  Past Medical History, Surgical history, Social history, and Family History without any changes on review.    Physical Exam:  Blood pressure (!) 157/69, pulse 77, temperature 98.5 F (36.9 C), temperature source Oral, resp. rate 20, height 5\' 10"  (1.778 m), weight 169 lb 1.6 oz (76.7 kg), SpO2 100 %.     ECOG: 0   General appearance: Alert, awake without any distress. Head: Atraumatic without abnormalities Oropharynx: Without any thrush  or ulcers. Eyes: No scleral icterus. Lymph nodes: No lymphadenopathy noted in the cervical, supraclavicular, or axillary nodes Heart:regular rate and rhythm, without any murmurs or gallops.   Lung: Clear to auscultation without any rhonchi, wheezes or dullness to percussion. Abdomin: Soft, nontender without any shifting dullness or ascites. Musculoskeletal: Right ankle swelling noted with limited range of motion.  Painful on palpation. Neurological: No motor or sensory deficits. Skin: No rashes or lesions.        Lab Results: Lab Results  Component Value Date   WBC 5.9 06/25/2019   HGB 12.6 (L) 06/25/2019   HCT 37.0 (L) 06/25/2019   MCV 99.7 06/25/2019   PLT 253 06/25/2019     Chemistry      Component Value Date/Time   NA 138 06/25/2019 1456   K 3.9 06/25/2019 1456   CL 103 06/25/2019 1456   CO2 25 06/25/2019 1456   BUN 17 06/25/2019 1456   CREATININE 0.80 06/25/2019 1456      Component Value Date/Time   CALCIUM 9.3 06/25/2019 1456   ALKPHOS 72 06/25/2019 1456   AST 13 (L) 06/25/2019 1456   ALT 12 06/25/2019 1456   BILITOT 0.2 (L) 06/25/2019 1456        Results for ELIGE, STOVES (MRN [id number]) as of 06/25/2019 15:48  Ref. Range 01/06/2019 14:27 03/18/2019 14:45  Prostate Specific Ag, Serum Latest Ref Range: 0.0 - 4.0 ng/mL <0.1 <0.1     Impression and Plan:  68 year old man with:  1.  Castration-sensitive prostate cancer with lymphadenopathy diagnosed in February 2019.    He continues to tolerate Xtandi with excellent PSA response and overall no complications.  Risks and benefits of continuing this therapy long-term was reviewed.  Potential complications including arthralgias, myalgias, hot flashes and rarely seizures.  After discussion today he is agreeable to continue.  Different salvage options if he developed castration resistant disease were reiterated.  He has been include chemotherapy, Trudi Ida among others.  2.  Androgen deprivation: He is currently  receiving that under the care of Dr. Gloriann Loan.  I recommended continuing this for the time being.  3.  Right ankle injury: No fractures noted and appears to be improving at this time.  4.  Prognosis and goals of care: Therapy remains palliative although his performance status is excellent and aggressive measures are warranted.  5.  Hypertension: He continues to monitor his blood pressure between visits and has been close to normal range.  We will monitor blood pressure on Xtandi.  6  Follow-up: In 3 months for repeat evaluation.  25  minutes was spent with the patient face-to-face today.  More than 50% of time was dedicated to reviewing his disease status, treatment options and complications of therapy.  Zola Button, MD 8/21/20203:44 PM

## 2019-06-26 LAB — PROSTATE-SPECIFIC AG, SERUM (LABCORP): Prostate Specific Ag, Serum: <0.1 ng/mL (ref 0.0–4.0)

## 2019-06-28 ENCOUNTER — Telehealth: Payer: Self-pay

## 2019-06-28 NOTE — Telephone Encounter (Signed)
-----   Message from Wyatt Portela, MD sent at 06/28/2019  8:49 AM EDT ----- Please let him know his PSA still low

## 2019-06-28 NOTE — Telephone Encounter (Signed)
Contacted patient and made aware of PSA results. 

## 2019-06-29 ENCOUNTER — Telehealth: Payer: Self-pay | Admitting: Oncology

## 2019-06-29 NOTE — Telephone Encounter (Signed)
Called and spoke with patient. Confirmed Nov. appt

## 2019-07-09 ENCOUNTER — Other Ambulatory Visit: Payer: Self-pay

## 2019-07-09 DIAGNOSIS — C61 Malignant neoplasm of prostate: Secondary | ICD-10-CM

## 2019-07-09 MED ORDER — XTANDI 40 MG PO CAPS: 160.0000 mg | ORAL_CAPSULE | Freq: Every day | ORAL | 0 refills | Status: DC

## 2019-07-19 ENCOUNTER — Telehealth: Payer: Self-pay

## 2019-07-19 NOTE — Telephone Encounter (Signed)
Oral Oncology Patient Advocate Encounter  The patient called me today to talk about his renewal with [employer].  He has had a change in income and I noted that on the renewal application. I informed the patient that I would get the application completed and faxed to [facility] and let him know if they needed more information from him.  He verbalized understanding and great appreciation.  [place] Patient [place] Phone [phone] Fax [phone] 07/19/2019   10:47 AM

## 2019-07-20 DIAGNOSIS — H40033 Anatomical narrow angle, bilateral: Secondary | ICD-10-CM | POA: Diagnosis not present

## 2019-07-20 DIAGNOSIS — H40013 Open angle with borderline findings, low risk, bilateral: Secondary | ICD-10-CM | POA: Diagnosis not present

## 2019-07-20 DIAGNOSIS — H2513 Age-related nuclear cataract, bilateral: Secondary | ICD-10-CM | POA: Diagnosis not present

## 2019-08-02 ENCOUNTER — Other Ambulatory Visit: Payer: Self-pay

## 2019-08-02 DIAGNOSIS — C61 Malignant neoplasm of prostate: Secondary | ICD-10-CM

## 2019-08-02 MED ORDER — XTANDI 40 MG PO CAPS: 160.0000 mg | ORAL_CAPSULE | Freq: Every day | ORAL | 0 refills | Status: DC

## 2019-08-23 ENCOUNTER — Other Ambulatory Visit: Payer: Self-pay

## 2019-08-23 DIAGNOSIS — C61 Malignant neoplasm of prostate: Secondary | ICD-10-CM

## 2019-08-23 MED ORDER — XTANDI 40 MG PO CAPS: 160.0000 mg | ORAL_CAPSULE | Freq: Every day | ORAL | 0 refills | Status: DC

## 2019-08-26 DIAGNOSIS — C61 Malignant neoplasm of prostate: Secondary | ICD-10-CM | POA: Diagnosis not present

## 2019-08-27 NOTE — Telephone Encounter (Signed)
Oral Oncology Patient Advocate Encounter  The patient called and stated he has received a call from [place] asking about his renewal application for [id number].  I went ahead and faxed the completed renewal application today, [id number].  This encounter will be updated until final determination.  [place] Patient [place] Phone [phone] Fax [phone] 08/27/2019   11:56 AM

## 2019-09-02 DIAGNOSIS — R351 Nocturia: Secondary | ICD-10-CM | POA: Diagnosis not present

## 2019-09-02 DIAGNOSIS — C61 Malignant neoplasm of prostate: Secondary | ICD-10-CM | POA: Diagnosis not present

## 2019-09-02 DIAGNOSIS — C775 Secondary and unspecified malignant neoplasm of intrapelvic lymph nodes: Secondary | ICD-10-CM | POA: Diagnosis not present

## 2019-09-10 ENCOUNTER — Other Ambulatory Visit: Payer: Self-pay

## 2019-09-10 DIAGNOSIS — C61 Malignant neoplasm of prostate: Secondary | ICD-10-CM

## 2019-09-10 MED ORDER — XTANDI 40 MG PO CAPS: 160.0000 mg | ORAL_CAPSULE | Freq: Every day | ORAL | 0 refills | Status: DC

## 2019-09-24 ENCOUNTER — Inpatient Hospital Stay: Payer: Medicare Other

## 2019-09-24 ENCOUNTER — Inpatient Hospital Stay: Payer: Medicare Other | Attending: Oncology | Admitting: Oncology

## 2019-09-24 ENCOUNTER — Other Ambulatory Visit: Payer: Self-pay

## 2019-09-24 VITALS — BP 135/81 | HR 78 | Temp 97.8°F | Resp 18 | Ht 70.0 in | Wt 168.3 lb

## 2019-09-24 DIAGNOSIS — R590 Localized enlarged lymph nodes: Secondary | ICD-10-CM | POA: Diagnosis not present

## 2019-09-24 DIAGNOSIS — C61 Malignant neoplasm of prostate: Secondary | ICD-10-CM | POA: Diagnosis not present

## 2019-09-24 DIAGNOSIS — I10 Essential (primary) hypertension: Secondary | ICD-10-CM | POA: Insufficient documentation

## 2019-09-24 DIAGNOSIS — Z79899 Other long term (current) drug therapy: Secondary | ICD-10-CM | POA: Diagnosis not present

## 2019-09-24 DIAGNOSIS — Z791 Long term (current) use of non-steroidal anti-inflammatories (NSAID): Secondary | ICD-10-CM | POA: Diagnosis not present

## 2019-09-24 DIAGNOSIS — Z79818 Long term (current) use of other agents affecting estrogen receptors and estrogen levels: Secondary | ICD-10-CM | POA: Insufficient documentation

## 2019-09-24 LAB — CBC WITH DIFFERENTIAL (CANCER CENTER ONLY)
Abs Immature Granulocytes: 0 10*3/uL (ref 0.00–0.07)
Basophils Absolute: 0 10*3/uL (ref 0.0–0.1)
Basophils Relative: 1 %
Eosinophils Absolute: 0.1 10*3/uL (ref 0.0–0.5)
Eosinophils Relative: 2 %
HCT: 39.5 % (ref 39.0–52.0)
Hemoglobin: 13.6 g/dL (ref 13.0–17.0)
Immature Granulocytes: 0 %
Lymphocytes Relative: 37 %
Lymphs Abs: 1.6 10*3/uL (ref 0.7–4.0)
MCH: 33.8 pg (ref 26.0–34.0)
MCHC: 34.4 g/dL (ref 30.0–36.0)
MCV: 98.3 fL (ref 80.0–100.0)
Monocytes Absolute: 0.4 10*3/uL (ref 0.1–1.0)
Monocytes Relative: 8 %
Neutro Abs: 2.2 10*3/uL (ref 1.7–7.7)
Neutrophils Relative %: 52 %
Platelet Count: 224 10*3/uL (ref 150–400)
RBC: 4.02 MIL/uL — ABNORMAL LOW (ref 4.22–5.81)
RDW: 12.1 % (ref 11.5–15.5)
WBC Count: 4.3 10*3/uL (ref 4.0–10.5)
nRBC: 0 % (ref 0.0–0.2)

## 2019-09-24 LAB — CMP (CANCER CENTER ONLY)
ALT: 15 U/L (ref 0–44)
AST: 17 U/L (ref 15–41)
Albumin: 3.8 g/dL (ref 3.5–5.0)
Alkaline Phosphatase: 64 U/L (ref 38–126)
Anion gap: 10 (ref 5–15)
BUN: 12 mg/dL (ref 8–23)
CO2: 24 mmol/L (ref 22–32)
Calcium: 9.3 mg/dL (ref 8.9–10.3)
Chloride: 105 mmol/L (ref 98–111)
Creatinine: 0.81 mg/dL (ref 0.61–1.24)
GFR, Est AFR Am: >60 mL/min (ref >60)
GFR, Estimated: >60 mL/min (ref >60)
Glucose, Bld: 112 mg/dL — ABNORMAL HIGH (ref 70–99)
Potassium: 4.1 mmol/L (ref 3.5–5.1)
Sodium: 139 mmol/L (ref 135–145)
Total Bilirubin: 0.3 mg/dL (ref 0.3–1.2)
Total Protein: 6.5 g/dL (ref 6.5–8.1)

## 2019-09-24 NOTE — Progress Notes (Signed)
Hematology and Oncology Follow Up Visit  Phillip Brown [id number] 05/28/1951 68 y.o. 09/24/2019 9:51 AM System, Pcp Not InNo ref. provider found   Principle Diagnosis: 68 year old man with castration-sensitive prostate cancer diagnosed in 2019 after presenting with PSA 118 with Gleason score 4+5 = 9.  He has lymphadenopathy only.   Prior Therapy:  Prostate biopsy in February 2019 which showed Gleason score 4+5 = 9 and 2 cores with 6 other cores of Gleason score 8.     Current therapy:  Androgen deprivation therapy started under the care of Dr. Gloriann Brown for which she receives Lupron periodically.  Xtandi 160 mg daily started October 2019.   Interim History: [name] returns today for a repeat evaluation.  Since the last visit, he reports no major changes in his health.  He continues to tolerate Xtandi without any recent complaints.  He denies any nausea, fatigue or abdominal pain.  He denies any worsening bone pain or pathological fractures.  His performance status and quality of life remains unchanged.   He denied headaches, blurry vision, syncope or seizures.  Denies any fevers, chills or sweats.  Denied chest pain, palpitation, orthopnea or leg edema.  Denied cough, wheezing or hemoptysis.  Denied nausea, vomiting or abdominal pain.  Denies any constipation or diarrhea.  Denies any frequency urgency or hesitancy.  Denies any arthralgias or myalgias.  Denies any skin rashes or lesions.  Denies any bleeding or clotting tendency.  Denies any easy bruising.  Denies any hair or nail changes.  Denies any anxiety or depression.  Remaining review of system is negative.          Medications: Without any changes on review. Current Outpatient Medications  Medication Sig Dispense Refill  . acetaminophen (TYLENOL) 500 MG tablet Take 500-1,000 mg by mouth every 6 (six) hours as needed (for pain or headaches).    Marland Kitchen BLACK PEPPER-TURMERIC PO Take 1 tablet by mouth daily.    . enzalutamide  (XTANDI) 40 MG capsule Take 4 capsules (160 mg total) by mouth daily. 120 capsule 0  . Leuprolide Acetate (LUPRON IJ) Inject 1 Dose as directed every 6 (six) months.    . Multiple Vitamin (MULTIVITAMIN WITH MINERALS) TABS tablet Take 2 tablets by mouth daily.    . Multiple Vitamin (MULTIVITAMIN) tablet Take 2 tablets by mouth 2 (two) times daily. "bone up" calcium enriched multivitamin    . Multiple Vitamins-Minerals (PRESERVISION AREDS) CAPS Take 1 capsule by mouth daily.    . naproxen (NAPROSYN) 500 MG tablet Take 1 tablet (500 mg total) by mouth 2 (two) times daily with a meal. (Patient taking differently: Take 500 mg by mouth 2 (two) times daily as needed. ) 40 tablet 1  . OVER THE COUNTER MEDICATION Take 1 tablet by mouth daily as needed (energy). "blood builder" iron complex    . oxyCODONE (OXY IR/ROXICODONE) 5 MG immediate release tablet Take 1-2 tablets (5-10 mg total) by mouth every 6 (six) hours as needed for moderate pain, severe pain or breakthrough pain. 30 tablet 0  . tamsulosin (FLOMAX) 0.4 MG CAPS capsule Take 1 capsule (0.4 mg total) by mouth daily. 30 capsule 0  . Tryptophan 500 MG TABS Take 500 mg by mouth at bedtime as needed (sleep).     No current facility-administered medications for this visit.      Allergies: No Known Allergies  Past Medical History, Surgical history, Social history, and Family History unchanged on review.    Physical Exam:   Blood pressure 135/81,  pulse 78, temperature 97.8 F (36.6 C), temperature source Temporal, resp. rate 18, height 5\' 10"  (1.778 m), weight 168 lb 4.8 oz (76.3 kg), SpO2 100 %.     ECOG: 0    General appearance: Comfortable appearing without any discomfort Head: Normocephalic without any trauma Oropharynx: Mucous membranes are moist and pink without any thrush or ulcers. Eyes: Pupils are equal and round reactive to light. Lymph nodes: No cervical, supraclavicular, inguinal or axillary lymphadenopathy.    Heart:regular rate and rhythm.  S1 and S2 without leg edema. Lung: Clear without any rhonchi or wheezes.  No dullness to percussion. Abdomin: Soft, nontender, nondistended with good bowel sounds.  No hepatosplenomegaly. Musculoskeletal: No joint deformity or effusion.  Full range of motion noted. Neurological: No deficits noted on motor, sensory and deep tendon reflex exam. Skin: No petechial rash or dryness.  Appeared moist.          Lab Results: Lab Results  Component Value Date   WBC 5.9 06/25/2019   HGB 12.6 (L) 06/25/2019   HCT 37.0 (L) 06/25/2019   MCV 99.7 06/25/2019   PLT 253 06/25/2019     Chemistry      Component Value Date/Time   NA 138 06/25/2019 1456   K 3.9 06/25/2019 1456   CL 103 06/25/2019 1456   CO2 25 06/25/2019 1456   BUN 17 06/25/2019 1456   CREATININE 0.80 06/25/2019 1456      Component Value Date/Time   CALCIUM 9.3 06/25/2019 1456   ALKPHOS 72 06/25/2019 1456   AST 13 (L) 06/25/2019 1456   ALT 12 06/25/2019 1456   BILITOT 0.2 (L) 06/25/2019 1456        Results for Phillip, Brown (MRN [id number]) as of 09/24/2019 09:52  Ref. Range 03/18/2019 14:45 06/25/2019 14:56  Prostate Specific Ag, Serum Latest Ref Range: 0.0 - 4.0 ng/mL <0.1 <0.1      Impression and Plan:  68 year old man with:  1.  Advanced prostate cancer with lymphadenopathy noted in 2019.  He has castration-sensitive disease at this time.    He has no issues related to Xtandi at this time with excellent PSA response.  The natural course of this disease as well as future treatment options were reiterated specially if he develops castration-resistant disease.  His options would include systemic chemotherapy among others.  Long-term complication associated with Phillip Brown was reiterated including weight gain, hot flashes as well as rarely seizures.  He is agreeable to continue.  2.  Androgen deprivation: I recommended continuing this for the time being to be given indefinitely.  He  continues to receive it under care of [facility].  3.  Right ankle injury: Resolved at this time.  4.  Prognosis and goals of care: His disease remains incurable although aggressive measures are warranted given his excellent performance status.  5.  Hypertension: His blood pressure is close to normal range at this time we will continue to monitor on Zytiga.  6  Follow-up: He will return in 3 months for repeat follow-up.  25  minutes was spent with the patient face-to-face today.  More than 50% of time was spent on updating his disease status, reviewing laboratory data as well as coordinating future plan of care including potential treatment options for the future.  Zola Button, MD 11/20/20209:51 AM

## 2019-09-25 LAB — PROSTATE-SPECIFIC AG, SERUM (LABCORP): Prostate Specific Ag, Serum: <0.1 ng/mL (ref 0.0–4.0)

## 2019-09-27 ENCOUNTER — Telehealth: Payer: Self-pay

## 2019-09-27 ENCOUNTER — Telehealth: Payer: Self-pay | Admitting: Oncology

## 2019-09-27 NOTE — Telephone Encounter (Signed)
-----   Message from Wyatt Portela, MD sent at 09/27/2019  8:48 AM EST ----- Please let him know his PSA is still low

## 2019-09-27 NOTE — Telephone Encounter (Signed)
Contacted patient and made aware of PSA results. 

## 2019-09-27 NOTE — Telephone Encounter (Signed)
Scheduled appt per 11/20 los.  Spoke with pt and they are aware of the appt date and time

## 2019-09-28 NOTE — Telephone Encounter (Signed)
Oral Oncology Pharmacist Encounter  Received notification from [place] patient assistance program that patient has been approved for [facility] for the next calendar year.  Dates of enrollment: 09/21/19-11/03/2020 Dispensing pharmacy: [facility] phone: [phone]  Johny Drilling, PharmD, BCPS, BCOP  09/28/2019 9:57 AM Oral Oncology Clinic [phone]

## 2019-10-12 ENCOUNTER — Other Ambulatory Visit: Payer: Self-pay

## 2019-10-12 DIAGNOSIS — C61 Malignant neoplasm of prostate: Secondary | ICD-10-CM

## 2019-10-12 MED ORDER — XTANDI 40 MG PO CAPS: 160.0000 mg | ORAL_CAPSULE | Freq: Every day | ORAL | 0 refills | Status: DC

## 2019-10-18 ENCOUNTER — Other Ambulatory Visit: Payer: Self-pay | Admitting: Oncology

## 2019-10-18 DIAGNOSIS — C61 Malignant neoplasm of prostate: Secondary | ICD-10-CM

## 2019-10-25 ENCOUNTER — Other Ambulatory Visit: Payer: Self-pay

## 2019-10-25 DIAGNOSIS — C61 Malignant neoplasm of prostate: Secondary | ICD-10-CM

## 2019-10-25 MED ORDER — XTANDI 40 MG PO CAPS: 160.0000 mg | ORAL_CAPSULE | Freq: Every day | ORAL | 0 refills | Status: DC

## 2019-11-10 ENCOUNTER — Other Ambulatory Visit: Payer: Self-pay

## 2019-11-10 DIAGNOSIS — C61 Malignant neoplasm of prostate: Secondary | ICD-10-CM

## 2019-11-10 MED ORDER — XTANDI 40 MG PO CAPS: 160.0000 mg | ORAL_CAPSULE | Freq: Every day | ORAL | 0 refills | Status: DC

## 2019-12-01 ENCOUNTER — Telehealth: Payer: Self-pay

## 2019-12-01 NOTE — Telephone Encounter (Signed)
-----   Message from Wyatt Portela, MD sent at 12/01/2019 11:06 AM EST ----- Regarding: RE: FYI I agree with your recommendations.  He can use Imodium for the diarrhea returns.  Thanks ----- Message ----- From: Tami Lin, RN Sent: 12/01/2019  10:41 AM EST To: Wyatt Portela, MD Subject: FYI                                            Patient called and said he has acute diarrhea x 3 days. He said he drank a moderate amount of alcohol over the weekend and he doesn't usually do this. He said he thinks this is what caused the diarrhea. He tried [employer] and states it helped a little bit. He denies nausea, fever, pain, dehydration, and fatigue. I instructed him to eat small meals often that include rice, toast, etc and to avoid caffeine/alcohol, and drink plenty of water. He states he had three episodes of diarrhea this morning and Pepto Bismol helped a little. Patient is on [facility]. Next appointment is 12/30/19.  Is it ok to recommend Imodium and have patient call back later this week if diarrhea persist? Lanelle Bal

## 2019-12-01 NOTE — Telephone Encounter (Signed)
Patient verbalized understanding and stated he will try Imodium. Patient instructed to call the office if symptoms worsen.

## 2019-12-06 ENCOUNTER — Other Ambulatory Visit: Payer: Self-pay | Admitting: Oncology

## 2019-12-06 DIAGNOSIS — C61 Malignant neoplasm of prostate: Secondary | ICD-10-CM

## 2019-12-19 ENCOUNTER — Ambulatory Visit: Payer: Medicare Other | Attending: Internal Medicine

## 2019-12-19 DIAGNOSIS — Z23 Encounter for immunization: Secondary | ICD-10-CM

## 2019-12-19 NOTE — Progress Notes (Signed)
   Covid-19 Vaccination Clinic  Name:  Phillip Brown    MRN: :[id number] DOB: July 01, 1951  12/19/2019  [name] was observed post Covid-19 immunization for 15 minutes without incidence. He was provided with Vaccine Information Sheet and instruction to access the V-Safe system.   [name] was instructed to call 911 with any severe reactions post vaccine: Marland Kitchen Difficulty breathing  . Swelling of your face and throat  . A fast heartbeat  . A bad rash all over your body  . Dizziness and weakness    Immunizations Administered    Name Date Dose VIS Date Route   Pfizer COVID-19 Vaccine 12/19/2019  2:46 PM 0.3 mL 10/15/2019 Intramuscular   Manufacturer: [place]   Lot: [id number]   [place]: [id number]

## 2019-12-30 ENCOUNTER — Other Ambulatory Visit: Payer: Self-pay

## 2019-12-30 ENCOUNTER — Inpatient Hospital Stay (HOSPITAL_BASED_OUTPATIENT_CLINIC_OR_DEPARTMENT_OTHER): Payer: Medicare Other | Admitting: Oncology

## 2019-12-30 ENCOUNTER — Inpatient Hospital Stay: Payer: Medicare Other | Attending: Oncology

## 2019-12-30 VITALS — BP 160/72 | HR 82 | Temp 98.3°F | Resp 18 | Wt 164.0 lb

## 2019-12-30 DIAGNOSIS — Z79818 Long term (current) use of other agents affecting estrogen receptors and estrogen levels: Secondary | ICD-10-CM | POA: Insufficient documentation

## 2019-12-30 DIAGNOSIS — Z791 Long term (current) use of non-steroidal anti-inflammatories (NSAID): Secondary | ICD-10-CM | POA: Diagnosis not present

## 2019-12-30 DIAGNOSIS — I10 Essential (primary) hypertension: Secondary | ICD-10-CM | POA: Insufficient documentation

## 2019-12-30 DIAGNOSIS — Z79899 Other long term (current) drug therapy: Secondary | ICD-10-CM | POA: Diagnosis not present

## 2019-12-30 DIAGNOSIS — C61 Malignant neoplasm of prostate: Secondary | ICD-10-CM

## 2019-12-30 DIAGNOSIS — R591 Generalized enlarged lymph nodes: Secondary | ICD-10-CM | POA: Diagnosis not present

## 2019-12-30 LAB — CBC WITH DIFFERENTIAL (CANCER CENTER ONLY)
Abs Immature Granulocytes: 0.01 10*3/uL (ref 0.00–0.07)
Basophils Absolute: 0 10*3/uL (ref 0.0–0.1)
Basophils Relative: 0 %
Eosinophils Absolute: 0.1 10*3/uL (ref 0.0–0.5)
Eosinophils Relative: 1 %
HCT: 37.8 % — ABNORMAL LOW (ref 39.0–52.0)
Hemoglobin: 12.9 g/dL — ABNORMAL LOW (ref 13.0–17.0)
Immature Granulocytes: 0 %
Lymphocytes Relative: 29 %
Lymphs Abs: 1.5 10*3/uL (ref 0.7–4.0)
MCH: 33.4 pg (ref 26.0–34.0)
MCHC: 34.1 g/dL (ref 30.0–36.0)
MCV: 97.9 fL (ref 80.0–100.0)
Monocytes Absolute: 0.4 10*3/uL (ref 0.1–1.0)
Monocytes Relative: 8 %
Neutro Abs: 3.3 10*3/uL (ref 1.7–7.7)
Neutrophils Relative %: 62 %
Platelet Count: 256 10*3/uL (ref 150–400)
RBC: 3.86 MIL/uL — ABNORMAL LOW (ref 4.22–5.81)
RDW: 12 % (ref 11.5–15.5)
WBC Count: 5.4 10*3/uL (ref 4.0–10.5)
nRBC: 0 % (ref 0.0–0.2)

## 2019-12-30 LAB — CMP (CANCER CENTER ONLY)
ALT: 15 U/L (ref 0–44)
AST: 14 U/L — ABNORMAL LOW (ref 15–41)
Albumin: 3.6 g/dL (ref 3.5–5.0)
Alkaline Phosphatase: 62 U/L (ref 38–126)
Anion gap: 8 (ref 5–15)
BUN: 15 mg/dL (ref 8–23)
CO2: 25 mmol/L (ref 22–32)
Calcium: 9.1 mg/dL (ref 8.9–10.3)
Chloride: 106 mmol/L (ref 98–111)
Creatinine: 0.97 mg/dL (ref 0.61–1.24)
GFR, Est AFR Am: >60 mL/min (ref >60)
GFR, Estimated: >60 mL/min (ref >60)
Glucose, Bld: 132 mg/dL — ABNORMAL HIGH (ref 70–99)
Potassium: 3.8 mmol/L (ref 3.5–5.1)
Sodium: 139 mmol/L (ref 135–145)
Total Bilirubin: 0.3 mg/dL (ref 0.3–1.2)
Total Protein: 6.5 g/dL (ref 6.5–8.1)

## 2019-12-30 NOTE — Progress Notes (Signed)
Hematology and Oncology Follow Up Visit  Phillip Brown [id number] 09/30/1951 69 y.o. 12/30/2019 2:36 PM System, Pcp Not InNo ref. provider found   Principle Diagnosis: 69 year old man with advanced prostate cancer with lymphadenopathy diagnosed in 2019.  He has castration-sensitive disease presenting with PSA 118, Gleason score 4+5 = 9.    Prior Therapy:  Prostate biopsy in February 2019 which showed Gleason score 4+5 = 9 and 2 cores with 6 other cores of Gleason score 8.     Current therapy:  Androgen deprivation therapy started under the care of Dr. Gloriann Loan for which she receives Lupron periodically.  Xtandi 160 mg daily started October 2019.   Interim History: [name] is here for a follow-up visit.  Since the last visit, he reports no major changes in his health.  He continues to tolerate Xtandi without any recent complaints.  He denies any nausea, fatigue pain.  He continues to exercise regularly and have lost weight intentionally.  He did have periodic episodes of diarrhea which was resolved at this time after changing his diet.          Medications: Unchanged on review. Current Outpatient Medications  Medication Sig Dispense Refill  . acetaminophen (TYLENOL) 500 MG tablet Take 500-1,000 mg by mouth every 6 (six) hours as needed (for pain or headaches).    Marland Kitchen BLACK PEPPER-TURMERIC PO Take 1 tablet by mouth daily.    Marland Kitchen Leuprolide Acetate (LUPRON IJ) Inject 1 Dose as directed every 6 (six) months.    . Multiple Vitamin (MULTIVITAMIN WITH MINERALS) TABS tablet Take 2 tablets by mouth daily.    . Multiple Vitamin (MULTIVITAMIN) tablet Take 2 tablets by mouth 2 (two) times daily. "bone up" calcium enriched multivitamin    . Multiple Vitamins-Minerals (PRESERVISION AREDS) CAPS Take 1 capsule by mouth daily.    . naproxen (NAPROSYN) 500 MG tablet Take 1 tablet (500 mg total) by mouth 2 (two) times daily with a meal. (Patient taking differently: Take 500 mg by mouth 2 (two) times  daily as needed. ) 40 tablet 1  . OVER THE COUNTER MEDICATION Take 1 tablet by mouth daily as needed (energy). "blood builder" iron complex    . oxyCODONE (OXY IR/ROXICODONE) 5 MG immediate release tablet Take 1-2 tablets (5-10 mg total) by mouth every 6 (six) hours as needed for moderate pain, severe pain or breakthrough pain. 30 tablet 0  . tamsulosin (FLOMAX) 0.4 MG CAPS capsule Take 1 capsule (0.4 mg total) by mouth daily. 30 capsule 0  . Tryptophan 500 MG TABS Take 500 mg by mouth at bedtime as needed (sleep).    [name] 40 MG capsule Take 4 capsules (160 mg total) by mouth daily. 120 capsule 0   No current facility-administered medications for this visit.     Allergies: No Known Allergies      Physical Exam:   Blood pressure (!) 160/72, pulse 82, temperature 98.3 F (36.8 C), temperature source Temporal, resp. rate 18, weight 164 lb (74.4 kg), SpO2 99 %.      ECOG: 0     General appearance: Alert, awake without any distress. Head: Atraumatic without abnormalities Oropharynx: Without any thrush or ulcers. Eyes: No scleral icterus. Lymph nodes: No lymphadenopathy noted in the cervical, supraclavicular, or axillary nodes Heart:regular rate and rhythm, without any murmurs or gallops.   Lung: Clear to auscultation without any rhonchi, wheezes or dullness to percussion. Abdomin: Soft, nontender without any shifting dullness or ascites. Musculoskeletal: No clubbing or cyanosis. Neurological: No  motor or sensory deficits. Skin: No rashes or lesions.           Lab Results: Lab Results  Component Value Date   WBC 5.4 12/30/2019   HGB 12.9 (L) 12/30/2019   HCT 37.8 (L) 12/30/2019   MCV 97.9 12/30/2019   PLT 256 12/30/2019     Chemistry      Component Value Date/Time   NA 139 09/24/2019 0939   K 4.1 09/24/2019 0939   CL 105 09/24/2019 0939   CO2 24 09/24/2019 0939   BUN 12 09/24/2019 0939   CREATININE 0.81 09/24/2019 0939      Component Value  Date/Time   CALCIUM 9.3 09/24/2019 0939   ALKPHOS 64 09/24/2019 0939   AST 17 09/24/2019 0939   ALT 15 09/24/2019 0939   BILITOT 0.3 09/24/2019 0939        Results for CARLA, JANICKE (MRN [id number]) as of 12/30/2019 14:37  Ref. Range 09/24/2019 09:39  Prostate Specific Ag, Serum Latest Ref Range: 0.0 - 4.0 ng/mL <0.1      Impression and Plan:  69 year old man with:  1.  Castration-sensitive prostate cancer with lymphadenopathy diagnosed in 2019.   He is currently on Xtandi with excellent PSA response.  The natural course of this disease was reviewed and alternative treatment options were discussed.  These options would include systemic chemotherapy among others if he develops castration-resistant disease.  He is agreeable to continue at this time given his excellent PSA response.  2.  Androgen deprivation: He is currently receiving it under the care of [facility].  I recommend continuing this indefinitely.  3.  Weight loss: Mostly intentional and related to diet changes as well as exercise regularly.  Encouraged him to continue with healthy diet and lifestyle.  4.  Prognosis and goals of care: Therapy remains palliative although aggressive measures are warranted given his excellent performance status.  5.  Hypertension: Mild elevation noted but has been close to normal range between visits.  We will continue to monitor on Xtandi.  6  Follow-up: He will return in 4 months for repeat follow-up.  30  minutes were dedicated to this encounter.  The time was spent on reviewing his disease status, treatment options and addressing complications noted to therapy.  Zola Button, MD 2/25/20212:36 PM

## 2019-12-31 ENCOUNTER — Telehealth: Payer: Self-pay | Admitting: Oncology

## 2019-12-31 ENCOUNTER — Telehealth: Payer: Self-pay

## 2019-12-31 LAB — PROSTATE-SPECIFIC AG, SERUM (LABCORP): Prostate Specific Ag, Serum: <0.1 ng/mL (ref 0.0–4.0)

## 2019-12-31 NOTE — Telephone Encounter (Signed)
Called and informed patient of PSA level. Patient verbalized understanding.  

## 2019-12-31 NOTE — Telephone Encounter (Signed)
-----   Message from Wyatt Portela, MD sent at 12/31/2019  8:16 AM EST ----- Please let him know his PSA is low

## 2019-12-31 NOTE — Telephone Encounter (Signed)
Scheduled appt per 2/25 los.  Sent a message to HIM pool to get a calendar mailed out

## 2020-01-05 ENCOUNTER — Other Ambulatory Visit: Payer: Self-pay

## 2020-01-05 DIAGNOSIS — C61 Malignant neoplasm of prostate: Secondary | ICD-10-CM

## 2020-01-05 MED ORDER — XTANDI 40 MG PO CAPS: 160.0000 mg | ORAL_CAPSULE | Freq: Every day | ORAL | 0 refills | Status: DC

## 2020-01-11 ENCOUNTER — Ambulatory Visit: Payer: Medicare Other | Attending: Internal Medicine

## 2020-01-11 DIAGNOSIS — Z23 Encounter for immunization: Secondary | ICD-10-CM

## 2020-01-11 NOTE — Progress Notes (Signed)
   Covid-19 Vaccination Clinic  Name:  Phillip Brown    MRN: [id number] DOB: May 26, 1951  01/11/2020  [name] was observed post Covid-19 immunization for 15 minutes without incident. He was provided with Vaccine Information Sheet and instruction to access the V-Safe system.   [name] was instructed to call 911 with any severe reactions post vaccine: Marland Kitchen Difficulty breathing  . Swelling of face and throat  . A fast heartbeat  . A bad rash all over body  . Dizziness and weakness   Immunizations Administered    Name Date Dose VIS Date Route   Pfizer COVID-19 Vaccine 01/11/2020  3:42 PM 0.3 mL 10/15/2019 Intramuscular   Manufacturer: [place]   Lot: [id number]   [place]: [id number]

## 2020-01-12 ENCOUNTER — Ambulatory Visit: Payer: Medicare Other

## 2020-01-17 DIAGNOSIS — H40023 Open angle with borderline findings, high risk, bilateral: Secondary | ICD-10-CM | POA: Diagnosis not present

## 2020-01-17 DIAGNOSIS — H02831 Dermatochalasis of right upper eyelid: Secondary | ICD-10-CM | POA: Diagnosis not present

## 2020-01-17 DIAGNOSIS — H353131 Nonexudative age-related macular degeneration, bilateral, early dry stage: Secondary | ICD-10-CM | POA: Diagnosis not present

## 2020-01-17 DIAGNOSIS — H25013 Cortical age-related cataract, bilateral: Secondary | ICD-10-CM | POA: Diagnosis not present

## 2020-01-17 DIAGNOSIS — H2513 Age-related nuclear cataract, bilateral: Secondary | ICD-10-CM | POA: Diagnosis not present

## 2020-01-17 DIAGNOSIS — H40033 Anatomical narrow angle, bilateral: Secondary | ICD-10-CM | POA: Diagnosis not present

## 2020-01-17 DIAGNOSIS — H0288B Meibomian gland dysfunction left eye, upper and lower eyelids: Secondary | ICD-10-CM | POA: Diagnosis not present

## 2020-01-17 DIAGNOSIS — H04123 Dry eye syndrome of bilateral lacrimal glands: Secondary | ICD-10-CM | POA: Diagnosis not present

## 2020-01-17 DIAGNOSIS — H0288A Meibomian gland dysfunction right eye, upper and lower eyelids: Secondary | ICD-10-CM | POA: Diagnosis not present

## 2020-01-17 DIAGNOSIS — H11823 Conjunctivochalasis, bilateral: Secondary | ICD-10-CM | POA: Diagnosis not present

## 2020-01-31 ENCOUNTER — Other Ambulatory Visit: Payer: Self-pay

## 2020-01-31 DIAGNOSIS — C61 Malignant neoplasm of prostate: Secondary | ICD-10-CM

## 2020-01-31 MED ORDER — XTANDI 40 MG PO CAPS: 160.0000 mg | ORAL_CAPSULE | Freq: Every day | ORAL | 0 refills | Status: DC

## 2020-02-24 DIAGNOSIS — C61 Malignant neoplasm of prostate: Secondary | ICD-10-CM | POA: Diagnosis not present

## 2020-03-02 DIAGNOSIS — C61 Malignant neoplasm of prostate: Secondary | ICD-10-CM | POA: Diagnosis not present

## 2020-03-02 DIAGNOSIS — C775 Secondary and unspecified malignant neoplasm of intrapelvic lymph nodes: Secondary | ICD-10-CM | POA: Diagnosis not present

## 2020-03-07 ENCOUNTER — Other Ambulatory Visit: Payer: Self-pay | Admitting: Oncology

## 2020-03-07 DIAGNOSIS — C61 Malignant neoplasm of prostate: Secondary | ICD-10-CM

## 2020-04-05 ENCOUNTER — Other Ambulatory Visit: Payer: Self-pay | Admitting: Oncology

## 2020-04-05 DIAGNOSIS — C61 Malignant neoplasm of prostate: Secondary | ICD-10-CM

## 2020-04-26 ENCOUNTER — Other Ambulatory Visit: Payer: Self-pay | Admitting: Oncology

## 2020-04-26 DIAGNOSIS — C61 Malignant neoplasm of prostate: Secondary | ICD-10-CM

## 2020-04-27 ENCOUNTER — Inpatient Hospital Stay: Payer: Medicare Other | Attending: Oncology | Admitting: Oncology

## 2020-04-27 ENCOUNTER — Inpatient Hospital Stay: Payer: Medicare Other

## 2020-04-27 ENCOUNTER — Other Ambulatory Visit: Payer: Self-pay

## 2020-04-27 VITALS — BP 154/74 | HR 74 | Temp 97.8°F | Resp 20 | Ht 70.0 in | Wt 166.8 lb

## 2020-04-27 DIAGNOSIS — C61 Malignant neoplasm of prostate: Secondary | ICD-10-CM | POA: Insufficient documentation

## 2020-04-27 DIAGNOSIS — I10 Essential (primary) hypertension: Secondary | ICD-10-CM | POA: Diagnosis not present

## 2020-04-27 LAB — CBC WITH DIFFERENTIAL (CANCER CENTER ONLY)
Abs Immature Granulocytes: 0.01 10*3/uL (ref 0.00–0.07)
Basophils Absolute: 0 10*3/uL (ref 0.0–0.1)
Basophils Relative: 0 %
Eosinophils Absolute: 0.1 10*3/uL (ref 0.0–0.5)
Eosinophils Relative: 2 %
HCT: 37.6 % — ABNORMAL LOW (ref 39.0–52.0)
Hemoglobin: 12.9 g/dL — ABNORMAL LOW (ref 13.0–17.0)
Immature Granulocytes: 0 %
Lymphocytes Relative: 34 %
Lymphs Abs: 1.7 10*3/uL (ref 0.7–4.0)
MCH: 33.9 pg (ref 26.0–34.0)
MCHC: 34.3 g/dL (ref 30.0–36.0)
MCV: 98.7 fL (ref 80.0–100.0)
Monocytes Absolute: 0.3 10*3/uL (ref 0.1–1.0)
Monocytes Relative: 7 %
Neutro Abs: 2.8 10*3/uL (ref 1.7–7.7)
Neutrophils Relative %: 57 %
Platelet Count: 209 10*3/uL (ref 150–400)
RBC: 3.81 MIL/uL — ABNORMAL LOW (ref 4.22–5.81)
RDW: 12.3 % (ref 11.5–15.5)
WBC Count: 5 10*3/uL (ref 4.0–10.5)
nRBC: 0 % (ref 0.0–0.2)

## 2020-04-27 LAB — CMP (CANCER CENTER ONLY)
ALT: 15 U/L (ref 0–44)
AST: 14 U/L — ABNORMAL LOW (ref 15–41)
Albumin: 3.6 g/dL (ref 3.5–5.0)
Alkaline Phosphatase: 60 U/L (ref 38–126)
Anion gap: 11 (ref 5–15)
BUN: 14 mg/dL (ref 8–23)
CO2: 22 mmol/L (ref 22–32)
Calcium: 9.1 mg/dL (ref 8.9–10.3)
Chloride: 106 mmol/L (ref 98–111)
Creatinine: 0.9 mg/dL (ref 0.61–1.24)
GFR, Est AFR Am: >60 mL/min (ref >60)
GFR, Estimated: >60 mL/min (ref >60)
Glucose, Bld: 116 mg/dL — ABNORMAL HIGH (ref 70–99)
Potassium: 3.9 mmol/L (ref 3.5–5.1)
Sodium: 139 mmol/L (ref 135–145)
Total Bilirubin: 0.3 mg/dL (ref 0.3–1.2)
Total Protein: 6.6 g/dL (ref 6.5–8.1)

## 2020-04-27 NOTE — Progress Notes (Signed)
Hematology and Oncology Follow Up Visit  RODGERICK GILLIAND [id number] 23-Apr-1951 69 y.o. 04/27/2020 2:57 PM System, Pcp Not InNo ref. provider found   Principle Diagnosis: 69 year old man with castration-sensitive prostate cancer with lymphadenopathy presented with PSA 118, Gleason score 4+5 = 9 in 2019.  Prior Therapy:  Prostate biopsy in February 2019 which showed Gleason score 4+5 = 9 and 2 cores with 6 other cores of Gleason score 8.     Current therapy:  Eligard every 6 months under the care of Dr. Gloriann Loan.  Xtandi 160 mg daily started October 2019.   Interim History: [name] returns today for a follow-up evaluation.  Since the last visit, he reports no major changes in his health.  He continues to tolerate Xtandi without any new complaints.  He denies excessive fatigue tiredness or recent hospitalizations.  He denies any bone pain or pathological fractures.  He denies any recent hospitalization or illnesses.          Medications: Reviewed without changes. Current Outpatient Medications  Medication Sig Dispense Refill  . acetaminophen (TYLENOL) 500 MG tablet Take 500-1,000 mg by mouth every 6 (six) hours as needed (for pain or headaches).    Marland Kitchen BLACK PEPPER-TURMERIC PO Take 1 tablet by mouth daily.    Marland Kitchen Leuprolide Acetate (LUPRON IJ) Inject 1 Dose as directed every 6 (six) months.    . Multiple Vitamin (MULTIVITAMIN WITH MINERALS) TABS tablet Take 2 tablets by mouth daily.    . Multiple Vitamin (MULTIVITAMIN) tablet Take 2 tablets by mouth 2 (two) times daily. "bone up" calcium enriched multivitamin    . Multiple Vitamins-Minerals (PRESERVISION AREDS) CAPS Take 1 capsule by mouth daily.    . naproxen (NAPROSYN) 500 MG tablet Take 1 tablet (500 mg total) by mouth 2 (two) times daily with a meal. (Patient taking differently: Take 500 mg by mouth 2 (two) times daily as needed. ) 40 tablet 1  . OVER THE COUNTER MEDICATION Take 1 tablet by mouth daily as needed (energy). "blood  builder" iron complex    . oxyCODONE (OXY IR/ROXICODONE) 5 MG immediate release tablet Take 1-2 tablets (5-10 mg total) by mouth every 6 (six) hours as needed for moderate pain, severe pain or breakthrough pain. (Patient not taking: Reported on 12/30/2019) 30 tablet 0  . tamsulosin (FLOMAX) 0.4 MG CAPS capsule Take 1 capsule (0.4 mg total) by mouth daily. 30 capsule 0  . Tryptophan 500 MG TABS Take 500 mg by mouth at bedtime as needed (sleep).    [name] 40 MG capsule Take 4 capsules (160mg ) by mouth once daily as directed by physician. 120 capsule 0   No current facility-administered medications for this visit.     Allergies: No Known Allergies      Physical Exam:   Blood pressure (!) 154/74, pulse 74, temperature 97.8 F (36.6 C), temperature source Temporal, resp. rate 20, height 5\' 10"  (1.778 m), weight 166 lb 12.8 oz (75.7 kg), SpO2 100 %.      ECOG: 0    General appearance: Comfortable appearing without any discomfort Head: Normocephalic without any trauma Oropharynx: Mucous membranes are moist and pink without any thrush or ulcers. Eyes: Pupils are equal and round reactive to light. Lymph nodes: No cervical, supraclavicular, inguinal or axillary lymphadenopathy.   Heart:regular rate and rhythm.  S1 and S2 without leg edema. Lung: Clear without any rhonchi or wheezes.  No dullness to percussion. Abdomin: Soft, nontender, nondistended with good bowel sounds.  No hepatosplenomegaly. Musculoskeletal: No joint  deformity or effusion.  Full range of motion noted. Neurological: No deficits noted on motor, sensory and deep tendon reflex exam. Skin: No petechial rash or dryness.  Appeared moist.  .           Lab Results: Lab Results  Component Value Date   WBC 5.0 04/27/2020   HGB 12.9 (L) 04/27/2020   HCT 37.6 (L) 04/27/2020   MCV 98.7 04/27/2020   PLT 209 04/27/2020     Chemistry      Component Value Date/Time   NA 139 12/30/2019 1425   K 3.8 12/30/2019  1425   CL 106 12/30/2019 1425   CO2 25 12/30/2019 1425   BUN 15 12/30/2019 1425   CREATININE 0.97 12/30/2019 1425      Component Value Date/Time   CALCIUM 9.1 12/30/2019 1425   ALKPHOS 62 12/30/2019 1425   AST 14 (L) 12/30/2019 1425   ALT 15 12/30/2019 1425   BILITOT 0.3 12/30/2019 1425          Results for ALMON, WHITFORD (MRN [id number]) as of 04/27/2020 14:47  Ref. Range 09/24/2019 09:39 12/30/2019 14:25  Prostate Specific Ag, Serum Latest Ref Range: 0.0 - 4.0 ng/mL <0.1 <0.1     Impression and Plan:  69 year old man with:  1.  Advanced prostate cancer with lymphadenopathy diagnosed in 2019.  He has castration-sensitive disease.  He has tolerated Xtandi without any major complications at this time.  He denies any major complications or limitations ability to take it.  Risks and benefits of continuing this medication also treatment options for the future were reviewed.  Alternative treatment options for the future including systemic chemotherapy as well as a lutetium which has not been approved yet but appears to be promising.  2.  Androgen deprivation: He will continue to receive that under the care of Dr. Gloriann Loan.  He is currently on Eligard every 6 months.  Long-term complications including weight gain and hot flashes were reiterated.  3.  Fitness and exercise considerations: We have discussed these strategies for him in the past.  He continues to exercise regularly and improve his weight.  4.  Prognosis and goals of care: His disease is incurable although aggressive measures are warranted given his excellent performance status.  5.  Hypertension: His blood pressure is mildly elevated at this time we will continue to monitor on Xtandi.  6  Follow-up: He will return in 4 months for a follow-up.  30  minutes were spent on this visit.  The time was dedicated to reviewing his disease status, complications related to his cancer and cancer treatment.  We also reviewed future  treatment options.  Zola Button, MD 6/24/20212:57 PM

## 2020-04-28 ENCOUNTER — Telehealth: Payer: Self-pay

## 2020-04-28 LAB — PROSTATE-SPECIFIC AG, SERUM (LABCORP): Prostate Specific Ag, Serum: <0.1 ng/mL (ref 0.0–4.0)

## 2020-04-28 NOTE — Telephone Encounter (Signed)
-----   Message from Wyatt Portela, MD sent at 04/28/2020  8:49 AM EDT ----- Please let him know his PSA. Still low

## 2020-04-28 NOTE — Telephone Encounter (Signed)
Called patient and let him know his PSA is still low. Patient verbalized understanding.

## 2020-05-01 ENCOUNTER — Other Ambulatory Visit: Payer: Self-pay | Admitting: Oncology

## 2020-05-01 DIAGNOSIS — C61 Malignant neoplasm of prostate: Secondary | ICD-10-CM

## 2020-05-05 ENCOUNTER — Telehealth: Payer: Self-pay | Admitting: Oncology

## 2020-05-05 NOTE — Telephone Encounter (Signed)
Scheduled per los, patient has been called and notified. 

## 2020-05-29 ENCOUNTER — Other Ambulatory Visit: Payer: Self-pay | Admitting: Oncology

## 2020-05-29 DIAGNOSIS — C61 Malignant neoplasm of prostate: Secondary | ICD-10-CM

## 2020-06-22 ENCOUNTER — Other Ambulatory Visit: Payer: Self-pay | Admitting: Oncology

## 2020-06-22 DIAGNOSIS — C61 Malignant neoplasm of prostate: Secondary | ICD-10-CM

## 2020-07-03 DIAGNOSIS — Z23 Encounter for immunization: Secondary | ICD-10-CM | POA: Diagnosis not present

## 2020-07-20 ENCOUNTER — Telehealth: Payer: Self-pay

## 2020-07-20 ENCOUNTER — Other Ambulatory Visit: Payer: Self-pay

## 2020-07-20 DIAGNOSIS — C61 Malignant neoplasm of prostate: Secondary | ICD-10-CM

## 2020-07-20 MED ORDER — XTANDI 40 MG PO CAPS: ORAL_CAPSULE | ORAL | 0 refills | Status: DC

## 2020-07-20 NOTE — Telephone Encounter (Signed)
-----   Message from Wyatt Portela, MD sent at 07/20/2020  2:19 PM EDT ----- Please send a refill. Thanks ----- Message ----- From: Kennedy Bucker, LPN Sent: 2/95/1884   2:13 PM EDT To: Wyatt Portela, MD  Patient's pharmacy [facility] reach out for a refill on patient's Xtandi. Thank you   Maudie Mercury LPN

## 2020-07-20 NOTE — Telephone Encounter (Signed)
Called in refill per Dr Alen Blew. Gave verbal order per Dr Alen Blew to Almyra Free a pharmacist.

## 2020-07-24 DIAGNOSIS — H40033 Anatomical narrow angle, bilateral: Secondary | ICD-10-CM | POA: Diagnosis not present

## 2020-07-24 DIAGNOSIS — H2513 Age-related nuclear cataract, bilateral: Secondary | ICD-10-CM | POA: Diagnosis not present

## 2020-07-24 DIAGNOSIS — H40023 Open angle with borderline findings, high risk, bilateral: Secondary | ICD-10-CM | POA: Diagnosis not present

## 2020-07-24 DIAGNOSIS — H25013 Cortical age-related cataract, bilateral: Secondary | ICD-10-CM | POA: Diagnosis not present

## 2020-08-24 ENCOUNTER — Inpatient Hospital Stay (HOSPITAL_BASED_OUTPATIENT_CLINIC_OR_DEPARTMENT_OTHER): Payer: Medicare Other | Admitting: Oncology

## 2020-08-24 ENCOUNTER — Other Ambulatory Visit: Payer: Self-pay

## 2020-08-24 ENCOUNTER — Inpatient Hospital Stay: Payer: Medicare Other | Attending: Oncology

## 2020-08-24 VITALS — BP 146/71 | HR 63 | Temp 96.6°F | Resp 18 | Ht 70.0 in | Wt 166.5 lb

## 2020-08-24 DIAGNOSIS — Z79899 Other long term (current) drug therapy: Secondary | ICD-10-CM | POA: Insufficient documentation

## 2020-08-24 DIAGNOSIS — Z7182 Exercise counseling: Secondary | ICD-10-CM | POA: Insufficient documentation

## 2020-08-24 DIAGNOSIS — C61 Malignant neoplasm of prostate: Secondary | ICD-10-CM

## 2020-08-24 DIAGNOSIS — R591 Generalized enlarged lymph nodes: Secondary | ICD-10-CM | POA: Diagnosis not present

## 2020-08-24 DIAGNOSIS — I10 Essential (primary) hypertension: Secondary | ICD-10-CM | POA: Insufficient documentation

## 2020-08-24 LAB — CMP (CANCER CENTER ONLY)
ALT: 9 U/L (ref 0–44)
AST: 15 U/L (ref 15–41)
Albumin: 3.6 g/dL (ref 3.5–5.0)
Alkaline Phosphatase: 56 U/L (ref 38–126)
Anion gap: 9 (ref 5–15)
BUN: 15 mg/dL (ref 8–23)
CO2: 24 mmol/L (ref 22–32)
Calcium: 9.5 mg/dL (ref 8.9–10.3)
Chloride: 105 mmol/L (ref 98–111)
Creatinine: 0.76 mg/dL (ref 0.61–1.24)
GFR, Estimated: >60 mL/min (ref >60)
Glucose, Bld: 86 mg/dL (ref 70–99)
Potassium: 4 mmol/L (ref 3.5–5.1)
Sodium: 138 mmol/L (ref 135–145)
Total Bilirubin: 0.3 mg/dL (ref 0.3–1.2)
Total Protein: 6.5 g/dL (ref 6.5–8.1)

## 2020-08-24 LAB — CBC WITH DIFFERENTIAL (CANCER CENTER ONLY)
Abs Immature Granulocytes: 0.01 10*3/uL (ref 0.00–0.07)
Basophils Absolute: 0 10*3/uL (ref 0.0–0.1)
Basophils Relative: 1 %
Eosinophils Absolute: 0.1 10*3/uL (ref 0.0–0.5)
Eosinophils Relative: 1 %
HCT: 38.1 % — ABNORMAL LOW (ref 39.0–52.0)
Hemoglobin: 12.8 g/dL — ABNORMAL LOW (ref 13.0–17.0)
Immature Granulocytes: 0 %
Lymphocytes Relative: 39 %
Lymphs Abs: 2 10*3/uL (ref 0.7–4.0)
MCH: 33.3 pg (ref 26.0–34.0)
MCHC: 33.6 g/dL (ref 30.0–36.0)
MCV: 99.2 fL (ref 80.0–100.0)
Monocytes Absolute: 0.4 10*3/uL (ref 0.1–1.0)
Monocytes Relative: 8 %
Neutro Abs: 2.6 10*3/uL (ref 1.7–7.7)
Neutrophils Relative %: 51 %
Platelet Count: 235 10*3/uL (ref 150–400)
RBC: 3.84 MIL/uL — ABNORMAL LOW (ref 4.22–5.81)
RDW: 12.5 % (ref 11.5–15.5)
WBC Count: 5 10*3/uL (ref 4.0–10.5)
nRBC: 0 % (ref 0.0–0.2)

## 2020-08-24 NOTE — Progress Notes (Signed)
Hematology and Oncology Follow Up Visit  FINNEGAN GATTA [id number] 1951/02/13 69 y.o. 08/24/2020 2:19 PM Pcp, NoNo ref. provider found   Principle Diagnosis: 69 year old man with advanced prostate cancer diagnosed in 2019.  He presented with castration-sensitive disease, PSA 118 and Gleason score 4+5 = 9 at that time.  Prior Therapy:  Prostate biopsy in February 2019 which showed Gleason score 4+5 = 9 and 2 cores with 6 other cores of Gleason score 8.     Current therapy:  Eligard every 6 months under the care of Dr. Gloriann Loan.  Xtandi 160 mg daily started October 2019.   Interim History: [name] is here for a follow-up evaluation.  Since the last visit, he reports no major changes in his health.  Continues to tolerate Xtandi without any complaints.  Denies any recent hospitalization or illnesses.  Denies any abdominal pain or pathological fracture.  Chronic status quality of life remain excellent.          Medications: Updated on review. Current Outpatient Medications  Medication Sig Dispense Refill  . acetaminophen (TYLENOL) 500 MG tablet Take 500-1,000 mg by mouth every 6 (six) hours as needed (for pain or headaches).    Marland Kitchen BLACK PEPPER-TURMERIC PO Take 1 tablet by mouth daily.    . enzalutamide (XTANDI) 40 MG capsule Take 4 capsules (160mg ) by mouth once daily as directed by physician. 120 capsule 0  . Leuprolide Acetate (LUPRON IJ) Inject 1 Dose as directed every 6 (six) months.    . Multiple Vitamin (MULTIVITAMIN WITH MINERALS) TABS tablet Take 2 tablets by mouth daily.    . Multiple Vitamin (MULTIVITAMIN) tablet Take 2 tablets by mouth 2 (two) times daily. "bone up" calcium enriched multivitamin    . Multiple Vitamins-Minerals (PRESERVISION AREDS) CAPS Take 1 capsule by mouth daily.    . naproxen (NAPROSYN) 500 MG tablet Take 1 tablet (500 mg total) by mouth 2 (two) times daily with a meal. (Patient taking differently: Take 500 mg by mouth 2 (two) times daily as needed.  ) 40 tablet 1  . OVER THE COUNTER MEDICATION Take 1 tablet by mouth daily as needed (energy). "blood builder" iron complex    . oxyCODONE (OXY IR/ROXICODONE) 5 MG immediate release tablet Take 1-2 tablets (5-10 mg total) by mouth every 6 (six) hours as needed for moderate pain, severe pain or breakthrough pain. (Patient not taking: Reported on 12/30/2019) 30 tablet 0  . tamsulosin (FLOMAX) 0.4 MG CAPS capsule Take 1 capsule (0.4 mg total) by mouth daily. 30 capsule 0  . Tryptophan 500 MG TABS Take 500 mg by mouth at bedtime as needed (sleep).     No current facility-administered medications for this visit.     Allergies: No Known Allergies      Physical Exam:   Blood pressure (!) 146/71, pulse 63, temperature (!) 96.6 F (35.9 C), temperature source Tympanic, resp. rate 18, height 5\' 10"  (1.778 m), weight 166 lb 8 oz (75.5 kg), SpO2 100 %.      ECOG: 0    General appearance: Alert, awake without any distress. Head: Atraumatic without abnormalities Oropharynx: Without any thrush or ulcers. Eyes: No scleral icterus. Lymph nodes: No lymphadenopathy noted in the cervical, supraclavicular, or axillary nodes Heart:regular rate and rhythm, without any murmurs or gallops.   Lung: Clear to auscultation without any rhonchi, wheezes or dullness to percussion. Abdomin: Soft, nontender without any shifting dullness or ascites. Musculoskeletal: No clubbing or cyanosis. Neurological: No motor or sensory deficits. Skin: No rashes  or lesions.  .           Lab Results: Lab Results  Component Value Date   WBC 5.0 04/27/2020   HGB 12.9 (L) 04/27/2020   HCT 37.6 (L) 04/27/2020   MCV 98.7 04/27/2020   PLT 209 04/27/2020     Chemistry      Component Value Date/Time   NA 139 04/27/2020 1432   K 3.9 04/27/2020 1432   CL 106 04/27/2020 1432   CO2 22 04/27/2020 1432   BUN 14 04/27/2020 1432   CREATININE 0.90 04/27/2020 1432      Component Value Date/Time   CALCIUM 9.1  04/27/2020 1432   ALKPHOS 60 04/27/2020 1432   AST 14 (L) 04/27/2020 1432   ALT 15 04/27/2020 1432   BILITOT 0.3 04/27/2020 1432        Results for DILAN, NOVOSAD (MRN [id number]) as of 08/24/2020 14:19  Ref. Range 09/24/2019 09:39 12/30/2019 14:25 04/27/2020 14:32  Prostate Specific Ag, Serum Latest Ref Range: 0.0 - 4.0 ng/mL <0.1 <0.1 <0.1        Impression and Plan:  69 year old man with:  1.  Castration-sensitive prostate cancer presented with lymphadenopathy in 2019.    He continues to have excellent PSA response without any signs or symptoms of disease relapse currently.  His PSA continues to be undetectable without any major complications on Xtandi.  Risks and benefits of continuing this treatment long-term were reviewed.  Hypertension, fatigue, hematuria and rarely seizures were discussed.  He is agreeable to continue at this time.  Alternative treatment options were reviewed including such as chemotherapy among others were reviewed which will be deferred unless he has castration-resistant disease.  2.  Androgen deprivation: He is currently receiving that under the care of Dr. Gloriann Loan which I recommended continuing indefinitely.  3.  Fitness and exercise considerations: He remains active which a encouraged him to continue to do so.  4.  Prognosis and goals of care: His disease is incurable with aggressive measures are warranted given his excellent response to therapy.  5.  Hypertension: Blood pressure close to baseline we will continue to monitor on Xtandi.  6  Follow-up: In 4 months for repeat follow-up.  30  minutes were dedicated to this encounter.  Time was spent on reviewing disease status, discussing treatment options and future plan of care review.  Zola Button, MD 10/21/20212:19 PM

## 2020-08-25 ENCOUNTER — Telehealth: Payer: Self-pay | Admitting: *Deleted

## 2020-08-25 LAB — PROSTATE-SPECIFIC AG, SERUM (LABCORP): Prostate Specific Ag, Serum: <0.1 ng/mL (ref 0.0–4.0)

## 2020-08-25 NOTE — Telephone Encounter (Signed)
-----   Message from Wyatt Portela, MD sent at 08/25/2020  8:24 AM EDT ----- Please let him know his PSA is still low

## 2020-08-25 NOTE — Telephone Encounter (Signed)
Notified patient of his recent PSA level per Dr. Hazeline Junker request.  Patient denied having any further questions.

## 2020-08-31 DIAGNOSIS — C61 Malignant neoplasm of prostate: Secondary | ICD-10-CM | POA: Diagnosis not present

## 2020-09-26 ENCOUNTER — Other Ambulatory Visit: Payer: Self-pay

## 2020-09-26 DIAGNOSIS — C61 Malignant neoplasm of prostate: Secondary | ICD-10-CM

## 2020-09-26 MED ORDER — XTANDI 40 MG PO CAPS: ORAL_CAPSULE | ORAL | 0 refills | Status: DC

## 2020-10-02 ENCOUNTER — Other Ambulatory Visit: Payer: Self-pay | Admitting: Oncology

## 2020-11-01 ENCOUNTER — Telehealth: Payer: Self-pay

## 2020-11-01 NOTE — Telephone Encounter (Signed)
Patient has been re-enrolled in [employer] for [facility] at no cost 11/04/20-12/31-22  Xtandi phone [phone]  Cruzita Lederer [facility] Phone [phone] Fax [phone] 11/01/2020 11:20 AM

## 2020-11-13 ENCOUNTER — Other Ambulatory Visit: Payer: Self-pay | Admitting: Oncology

## 2020-12-14 ENCOUNTER — Telehealth: Payer: Self-pay | Admitting: Oncology

## 2020-12-14 NOTE — Telephone Encounter (Signed)
Called patient regarding 02/24 appointment, left a voicemail.

## 2020-12-18 ENCOUNTER — Other Ambulatory Visit: Payer: Self-pay | Admitting: Oncology

## 2020-12-28 ENCOUNTER — Inpatient Hospital Stay (HOSPITAL_BASED_OUTPATIENT_CLINIC_OR_DEPARTMENT_OTHER): Payer: Medicare Other | Admitting: Oncology

## 2020-12-28 ENCOUNTER — Other Ambulatory Visit: Payer: Self-pay

## 2020-12-28 ENCOUNTER — Inpatient Hospital Stay: Payer: Medicare Other | Attending: Oncology

## 2020-12-28 VITALS — BP 130/64 | HR 81 | Temp 97.0°F | Resp 18 | Ht 70.0 in | Wt 169.4 lb

## 2020-12-28 DIAGNOSIS — Z79818 Long term (current) use of other agents affecting estrogen receptors and estrogen levels: Secondary | ICD-10-CM | POA: Diagnosis not present

## 2020-12-28 DIAGNOSIS — Z791 Long term (current) use of non-steroidal anti-inflammatories (NSAID): Secondary | ICD-10-CM | POA: Diagnosis not present

## 2020-12-28 DIAGNOSIS — C61 Malignant neoplasm of prostate: Secondary | ICD-10-CM | POA: Diagnosis not present

## 2020-12-28 DIAGNOSIS — Z79899 Other long term (current) drug therapy: Secondary | ICD-10-CM | POA: Insufficient documentation

## 2020-12-28 LAB — CBC WITH DIFFERENTIAL (CANCER CENTER ONLY)
Abs Immature Granulocytes: 0.02 10*3/uL (ref 0.00–0.07)
Basophils Absolute: 0 10*3/uL (ref 0.0–0.1)
Basophils Relative: 1 %
Eosinophils Absolute: 0.1 10*3/uL (ref 0.0–0.5)
Eosinophils Relative: 2 %
HCT: 40.7 % (ref 39.0–52.0)
Hemoglobin: 13.9 g/dL (ref 13.0–17.0)
Immature Granulocytes: 0 %
Lymphocytes Relative: 29 %
Lymphs Abs: 1.6 10*3/uL (ref 0.7–4.0)
MCH: 33.7 pg (ref 26.0–34.0)
MCHC: 34.2 g/dL (ref 30.0–36.0)
MCV: 98.5 fL (ref 80.0–100.0)
Monocytes Absolute: 0.4 10*3/uL (ref 0.1–1.0)
Monocytes Relative: 7 %
Neutro Abs: 3.3 10*3/uL (ref 1.7–7.7)
Neutrophils Relative %: 61 %
Platelet Count: 219 10*3/uL (ref 150–400)
RBC: 4.13 MIL/uL — ABNORMAL LOW (ref 4.22–5.81)
RDW: 12.5 % (ref 11.5–15.5)
WBC Count: 5.4 10*3/uL (ref 4.0–10.5)
nRBC: 0 % (ref 0.0–0.2)

## 2020-12-28 LAB — CMP (CANCER CENTER ONLY)
ALT: 12 U/L (ref 0–44)
AST: 16 U/L (ref 15–41)
Albumin: 3.9 g/dL (ref 3.5–5.0)
Alkaline Phosphatase: 58 U/L (ref 38–126)
Anion gap: 7 (ref 5–15)
BUN: 17 mg/dL (ref 8–23)
CO2: 26 mmol/L (ref 22–32)
Calcium: 9.3 mg/dL (ref 8.9–10.3)
Chloride: 104 mmol/L (ref 98–111)
Creatinine: 0.85 mg/dL (ref 0.61–1.24)
GFR, Estimated: >60 mL/min (ref >60)
Glucose, Bld: 101 mg/dL — ABNORMAL HIGH (ref 70–99)
Potassium: 3.8 mmol/L (ref 3.5–5.1)
Sodium: 137 mmol/L (ref 135–145)
Total Bilirubin: 0.3 mg/dL (ref 0.3–1.2)
Total Protein: 6.9 g/dL (ref 6.5–8.1)

## 2020-12-28 NOTE — Progress Notes (Signed)
Hematology and Oncology Follow Up Visit  Phillip Brown [id number] 1951/05/31 70 y.o. 12/28/2020 1:02 PM Pcp, NoNo ref. provider found   Principle Diagnosis: 70 year old man with castration-sensitive advanced prostate cancer diagnosed in 2019.  He presented with lymphadenopathy, PSA 118 and Gleason score 4+5 = 9.  Prior Therapy:  Prostate biopsy in February 2019 which showed Gleason score 4+5 = 9 and 2 cores with 6 other cores of Gleason score 8.     Current therapy:  Eligard every 6 months under the care of Dr. Gloriann Loan.  Xtandi 160 mg daily started October 2019.   Interim History: Phillip Brown is here for repeat follow-up.  Since the last visit, he reports no major changes in his health.  He denies any bone pain or pathological fractures.  He denies any complications related to [facility] including weakness fatigue or tiredness.  His blood pressure has been under excellent control.  He denies any urinary frequency or urgency.  His performance status and quality of life remains excellent.  He has been exercising at home but not diligent as before.          Medications: Reviewed without any changes. Current Outpatient Medications  Medication Sig Dispense Refill  . acetaminophen (TYLENOL) 500 MG tablet Take 500-1,000 mg by mouth every 6 (six) hours as needed (for pain or headaches).    Marland Kitchen BLACK PEPPER-TURMERIC PO Take 1 tablet by mouth daily.    . enzalutamide (XTANDI) 40 MG capsule Take 4 capsules (160mg ) by mouth once daily as directed by physician. 120 capsule 0  . Leuprolide Acetate (LUPRON IJ) Inject 1 Dose as directed every 6 (six) months.    . Multiple Vitamin (MULTIVITAMIN WITH MINERALS) TABS tablet Take 2 tablets by mouth daily.    . Multiple Vitamin (MULTIVITAMIN) tablet Take 2 tablets by mouth 2 (two) times daily. "bone up" calcium enriched multivitamin    . Multiple Vitamins-Minerals (PRESERVISION AREDS) CAPS Take 1 capsule by mouth daily.    . naproxen (NAPROSYN) 500 MG  tablet Take 1 tablet (500 mg total) by mouth 2 (two) times daily with a meal. (Patient taking differently: Take 500 mg by mouth 2 (two) times daily as needed. ) 40 tablet 1  . OVER THE COUNTER MEDICATION Take 1 tablet by mouth daily as needed (energy). "blood builder" iron complex    . oxyCODONE (OXY IR/ROXICODONE) 5 MG immediate release tablet Take 1-2 tablets (5-10 mg total) by mouth every 6 (six) hours as needed for moderate pain, severe pain or breakthrough pain. (Patient not taking: Reported on 12/30/2019) 30 tablet 0  . tamsulosin (FLOMAX) 0.4 MG CAPS capsule Take 1 capsule (0.4 mg total) by mouth daily. 30 capsule 0  . Tryptophan 500 MG TABS Take 500 mg by mouth at bedtime as needed (sleep).    [name] 40 MG tablet Take 4 tablets (160mg ) by mouth once daily as directed by physician. 120 tablet 0   No current facility-administered medications for this visit.     Allergies: No Known Allergies      Physical Exam:     Blood pressure 130/64, pulse 81, temperature (!) 97 F (36.1 C), temperature source Tympanic, resp. rate 18, height 5\' 10"  (1.778 m), weight 169 lb 6.4 oz (76.8 kg), SpO2 98 %.     ECOG: 0    General appearance: Comfortable appearing without any discomfort Head: Normocephalic without any trauma Oropharynx: Mucous membranes are moist and pink without any thrush or ulcers. Eyes: Pupils are equal and round reactive  to light. Lymph nodes: No cervical, supraclavicular, inguinal or axillary lymphadenopathy.   Heart:regular rate and rhythm.  S1 and S2 without leg edema. Lung: Clear without any rhonchi or wheezes.  No dullness to percussion. Abdomin: Soft, nontender, nondistended with good bowel sounds.  No hepatosplenomegaly. Musculoskeletal: No joint deformity or effusion.  Full range of motion noted. Neurological: No deficits noted on motor, sensory and deep tendon reflex exam. Skin: No petechial rash or dryness.  Appeared moist.    .           Lab  Results: Lab Results  Component Value Date   WBC 5.0 08/24/2020   HGB 12.8 (L) 08/24/2020   HCT 38.1 (L) 08/24/2020   MCV 99.2 08/24/2020   PLT 235 08/24/2020     Chemistry      Component Value Date/Time   NA 138 08/24/2020 1424   K 4.0 08/24/2020 1424   CL 105 08/24/2020 1424   CO2 24 08/24/2020 1424   BUN 15 08/24/2020 1424   CREATININE 0.76 08/24/2020 1424      Component Value Date/Time   CALCIUM 9.5 08/24/2020 1424   ALKPHOS 56 08/24/2020 1424   AST 15 08/24/2020 1424   ALT 9 08/24/2020 1424   BILITOT 0.3 08/24/2020 1424                  Results for Phillip Brown, Phillip Brown (MRN [id number]) as of 12/28/2020 13:02  Ref. Range 04/27/2020 14:32 08/24/2020 14:24  Prostate Specific Ag, Serum Latest Ref Range: 0.0 - 4.0 ng/mL <0.1 <0.1     Impression and Plan:  70 year old man with:  1.  Advanced prostate cancer with lymphadenopathy diagnosed in 2019.  He has castration-sensitive disease at this time.  Risks and benefits of continuing Xtandi were discussed at this time.  Potential complications including hypertension, fatigue and rarely seizures were reviewed.  Alternative treatment options systemic chemotherapy on this will be deferred if he has disease progression.  He understands that eventually he will develop castration hyper resistant disease and additional therapies may be required.  2.  Androgen deprivation: I recommended continuing this indefinitely.  3.  Fitness and exercise considerations: We discussed different strategies to improve his cardiovascular performance as well as muscle strength and resistance training.  We also discussed the values of eating properly to minimize any excessive weight gain.  4.  Prognosis and goals of care: Therapy remains palliative although aggressive measures are warranted given his excellent performance status.  5.  Hypertension: We will continue to monitor on Xtandi without any issues at this time.  6  Follow-up: He will return  in 4 months for a follow-up evaluation.  30  minutes were spent on this visit.  The time was dedicated to reviewing disease status, discussing treatment options and future plan of care review.  Zola Button, MD 2/24/20221:02 PM

## 2020-12-29 ENCOUNTER — Telehealth: Payer: Self-pay

## 2020-12-29 LAB — PROSTATE-SPECIFIC AG, SERUM (LABCORP): Prostate Specific Ag, Serum: <0.1 ng/mL (ref 0.0–4.0)

## 2020-12-29 NOTE — Telephone Encounter (Signed)
Patient was made aware of lab results and verbalized understanding.

## 2020-12-29 NOTE — Telephone Encounter (Signed)
-----   Message from Wyatt Portela, MD sent at 12/29/2020  2:48 PM EST ----- Please let him know his PSA is still low

## 2021-01-17 DIAGNOSIS — H02831 Dermatochalasis of right upper eyelid: Secondary | ICD-10-CM | POA: Diagnosis not present

## 2021-01-17 DIAGNOSIS — H04123 Dry eye syndrome of bilateral lacrimal glands: Secondary | ICD-10-CM | POA: Diagnosis not present

## 2021-01-17 DIAGNOSIS — H01023 Squamous blepharitis right eye, unspecified eyelid: Secondary | ICD-10-CM | POA: Diagnosis not present

## 2021-01-17 DIAGNOSIS — H0288B Meibomian gland dysfunction left eye, upper and lower eyelids: Secondary | ICD-10-CM | POA: Diagnosis not present

## 2021-01-17 DIAGNOSIS — H40033 Anatomical narrow angle, bilateral: Secondary | ICD-10-CM | POA: Diagnosis not present

## 2021-01-17 DIAGNOSIS — H0288A Meibomian gland dysfunction right eye, upper and lower eyelids: Secondary | ICD-10-CM | POA: Diagnosis not present

## 2021-01-17 DIAGNOSIS — H5213 Myopia, bilateral: Secondary | ICD-10-CM | POA: Diagnosis not present

## 2021-01-17 DIAGNOSIS — H52223 Regular astigmatism, bilateral: Secondary | ICD-10-CM | POA: Diagnosis not present

## 2021-01-17 DIAGNOSIS — H01026 Squamous blepharitis left eye, unspecified eyelid: Secondary | ICD-10-CM | POA: Diagnosis not present

## 2021-01-17 DIAGNOSIS — H02834 Dermatochalasis of left upper eyelid: Secondary | ICD-10-CM | POA: Diagnosis not present

## 2021-01-17 DIAGNOSIS — H40023 Open angle with borderline findings, high risk, bilateral: Secondary | ICD-10-CM | POA: Diagnosis not present

## 2021-01-17 DIAGNOSIS — H524 Presbyopia: Secondary | ICD-10-CM | POA: Diagnosis not present

## 2021-01-18 ENCOUNTER — Other Ambulatory Visit: Payer: Self-pay | Admitting: Oncology

## 2021-02-06 ENCOUNTER — Other Ambulatory Visit: Payer: Self-pay | Admitting: Oncology

## 2021-02-22 DIAGNOSIS — C61 Malignant neoplasm of prostate: Secondary | ICD-10-CM | POA: Diagnosis not present

## 2021-03-01 ENCOUNTER — Other Ambulatory Visit: Payer: Self-pay | Admitting: *Deleted

## 2021-03-01 DIAGNOSIS — C61 Malignant neoplasm of prostate: Secondary | ICD-10-CM

## 2021-03-01 MED ORDER — XTANDI 40 MG PO CAPS: ORAL_CAPSULE | ORAL | 0 refills | Status: DC

## 2021-03-06 DIAGNOSIS — Z5111 Encounter for antineoplastic chemotherapy: Secondary | ICD-10-CM | POA: Diagnosis not present

## 2021-03-06 DIAGNOSIS — R3121 Asymptomatic microscopic hematuria: Secondary | ICD-10-CM | POA: Diagnosis not present

## 2021-03-06 DIAGNOSIS — C61 Malignant neoplasm of prostate: Secondary | ICD-10-CM | POA: Diagnosis not present

## 2021-03-19 ENCOUNTER — Other Ambulatory Visit: Payer: Self-pay | Admitting: Oncology

## 2021-03-19 DIAGNOSIS — C61 Malignant neoplasm of prostate: Secondary | ICD-10-CM

## 2021-04-26 ENCOUNTER — Inpatient Hospital Stay (HOSPITAL_BASED_OUTPATIENT_CLINIC_OR_DEPARTMENT_OTHER): Payer: Medicare Other | Admitting: Oncology

## 2021-04-26 ENCOUNTER — Other Ambulatory Visit: Payer: Self-pay

## 2021-04-26 ENCOUNTER — Inpatient Hospital Stay: Payer: Medicare Other | Attending: Oncology

## 2021-04-26 VITALS — BP 159/67 | HR 76 | Temp 97.4°F | Resp 18 | Ht 70.0 in | Wt 168.8 lb

## 2021-04-26 DIAGNOSIS — Z79899 Other long term (current) drug therapy: Secondary | ICD-10-CM | POA: Diagnosis not present

## 2021-04-26 DIAGNOSIS — I10 Essential (primary) hypertension: Secondary | ICD-10-CM | POA: Diagnosis not present

## 2021-04-26 DIAGNOSIS — C61 Malignant neoplasm of prostate: Secondary | ICD-10-CM | POA: Diagnosis not present

## 2021-04-26 LAB — CBC WITH DIFFERENTIAL (CANCER CENTER ONLY)
Abs Immature Granulocytes: 0.01 10*3/uL (ref 0.00–0.07)
Basophils Absolute: 0 10*3/uL (ref 0.0–0.1)
Basophils Relative: 1 %
Eosinophils Absolute: 0.1 10*3/uL (ref 0.0–0.5)
Eosinophils Relative: 1 %
HCT: 38.3 % — ABNORMAL LOW (ref 39.0–52.0)
Hemoglobin: 13 g/dL (ref 13.0–17.0)
Immature Granulocytes: 0 %
Lymphocytes Relative: 36 %
Lymphs Abs: 2.2 10*3/uL (ref 0.7–4.0)
MCH: 33.4 pg (ref 26.0–34.0)
MCHC: 33.9 g/dL (ref 30.0–36.0)
MCV: 98.5 fL (ref 80.0–100.0)
Monocytes Absolute: 0.3 10*3/uL (ref 0.1–1.0)
Monocytes Relative: 5 %
Neutro Abs: 3.5 10*3/uL (ref 1.7–7.7)
Neutrophils Relative %: 57 %
Platelet Count: 254 10*3/uL (ref 150–400)
RBC: 3.89 MIL/uL — ABNORMAL LOW (ref 4.22–5.81)
RDW: 12.2 % (ref 11.5–15.5)
WBC Count: 6.1 10*3/uL (ref 4.0–10.5)
nRBC: 0 % (ref 0.0–0.2)

## 2021-04-26 LAB — CMP (CANCER CENTER ONLY)
ALT: 14 U/L (ref 0–44)
AST: 15 U/L (ref 15–41)
Albumin: 3.6 g/dL (ref 3.5–5.0)
Alkaline Phosphatase: 56 U/L (ref 38–126)
Anion gap: 11 (ref 5–15)
BUN: 15 mg/dL (ref 8–23)
CO2: 23 mmol/L (ref 22–32)
Calcium: 9.4 mg/dL (ref 8.9–10.3)
Chloride: 107 mmol/L (ref 98–111)
Creatinine: 0.81 mg/dL (ref 0.61–1.24)
GFR, Estimated: >60 mL/min (ref >60)
Glucose, Bld: 112 mg/dL — ABNORMAL HIGH (ref 70–99)
Potassium: 3.9 mmol/L (ref 3.5–5.1)
Sodium: 141 mmol/L (ref 135–145)
Total Bilirubin: 0.3 mg/dL (ref 0.3–1.2)
Total Protein: 6.5 g/dL (ref 6.5–8.1)

## 2021-04-26 NOTE — Progress Notes (Signed)
Hematology and Oncology Follow Up Visit  Phillip Brown [id number] [birth year] 70 y.o. 04/26/2021 2:32 PM Pcp, NoNo ref. provider found   Principle Diagnosis: 70 year old man with advanced prostate cancer with lymphadenopathy diagnosed in 2019.  He presented with castration-sensitive disease, PSA 118 and Gleason score 4+5 = 9.  Prior Therapy:  Prostate biopsy in February 2019 which showed Gleason score 4+5 = 9 and 2 cores with 6 other cores of Gleason score 8.     Current therapy:  Eligard every 6 months under the care of Dr. Gloriann Loan.  Xtandi 160 mg daily started October 2019.   Interim History: Phillip Brown returns today for repeat evaluation.  Since last visit, he reports no major changes in his health.  He denies any recent complications related to [facility].  He denies any nausea, vomiting or abdominal pain.  He denies any recent hospitalizations or illnesses.  His performance status and quality of life remained excellent.  He continues to exercise periodically including walking and attending to activities of daily living.          Medications: Updated on review. Current Outpatient Medications  Medication Sig Dispense Refill   acetaminophen (TYLENOL) 500 MG tablet Take 500-1,000 mg by mouth every 6 (six) hours as needed (for pain or headaches).     BLACK PEPPER-TURMERIC PO Take 1 tablet by mouth daily.     Leuprolide Acetate (LUPRON IJ) Inject 1 Dose as directed every 6 (six) months.     Multiple Vitamin (MULTIVITAMIN WITH MINERALS) TABS tablet Take 2 tablets by mouth daily.     Multiple Vitamin (MULTIVITAMIN) tablet Take 2 tablets by mouth 2 (two) times daily. "bone up" calcium enriched multivitamin     Multiple Vitamins-Minerals (PRESERVISION AREDS) CAPS Take 1 capsule by mouth daily.     naproxen (NAPROSYN) 500 MG tablet Take 1 tablet (500 mg total) by mouth 2 (two) times daily with a meal. (Patient taking differently: Take 500 mg by mouth 2 (two) times daily as needed. ) 40  tablet 1   OVER THE COUNTER MEDICATION Take 1 tablet by mouth daily as needed (energy). "blood builder" iron complex     oxyCODONE (OXY IR/ROXICODONE) 5 MG immediate release tablet Take 1-2 tablets (5-10 mg total) by mouth every 6 (six) hours as needed for moderate pain, severe pain or breakthrough pain. (Patient not taking: Reported on 12/30/2019) 30 tablet 0   tamsulosin (FLOMAX) 0.4 MG CAPS capsule Take 1 capsule (0.4 mg total) by mouth daily. 30 capsule 0   Tryptophan 500 MG TABS Take 500 mg by mouth at bedtime as needed (sleep).     XTANDI 40 MG tablet Take 4 tablets (160mg ) by mouth once daily as directed by physician. 120 tablet 0   XTANDI 40 MG tablet Take 4 tablets (160mg ) by mouth once daily as directed by physician. 120 tablet 0   No current facility-administered medications for this visit.     Allergies: No Known Allergies      Physical Exam:   Blood pressure (!) 159/67, pulse 76, temperature (!) 97.4 F (36.3 C), temperature source Tympanic, resp. rate 18, height 5\' 10"  (1.778 m), weight 168 lb 12.8 oz (76.6 kg), SpO2 100 %.        ECOG: 0   General appearance: Alert, awake without any distress. Head: Atraumatic without abnormalities Oropharynx: Without any thrush or ulcers. Eyes: No scleral icterus. Lymph nodes: No lymphadenopathy noted in the cervical, supraclavicular, or axillary nodes Heart:regular rate and rhythm, without any murmurs or  gallops.   Lung: Clear to auscultation without any rhonchi, wheezes or dullness to percussion. Abdomin: Soft, nontender without any shifting dullness or ascites. Musculoskeletal: No clubbing or cyanosis. Neurological: No motor or sensory deficits. Skin: No rashes or lesions.              Lab Results: Lab Results  Component Value Date   WBC 5.4 12/28/2020   HGB 13.9 12/28/2020   HCT 40.7 12/28/2020   MCV 98.5 12/28/2020   PLT 219 12/28/2020     Chemistry      Component Value Date/Time   NA 137  12/28/2020 1252   K 3.8 12/28/2020 1252   CL 104 12/28/2020 1252   CO2 26 12/28/2020 1252   BUN 17 12/28/2020 1252   CREATININE 0.85 12/28/2020 1252      Component Value Date/Time   CALCIUM 9.3 12/28/2020 1252   ALKPHOS 58 12/28/2020 1252   AST 16 12/28/2020 1252   ALT 12 12/28/2020 1252   BILITOT 0.3 12/28/2020 1252       Results for Phillip Brown (MRN [id number]) as of 04/26/2021 14:34  Ref. Range 12/28/2020 12:52  Prostate Specific Ag, Serum Latest Ref Range: 0.0 - 4.0 ng/mL <0.1        Impression and Plan:  69 year old man with:   1.  Castration-sensitive advanced prostate cancer with lymphadenopathy diagnosed in 2019.    He continues to have excellent response to [facility] with PSA remains undetectable.  Risks and benefits of continuing this treatment were discussed.  Complications include weight gain, fatigue, hypertension and rarely seizures.  Alternative options such as systemic chemotherapy or lutetium 117 will be deferred unless he has progression of disease.  These will be considered if his PSA starts to rise.   2.  Androgen deprivation: He is currently receiving that under the care of [facility].  This will continue indefinitely.  3.  Fitness and exercise considerations: I continue to encourage a healthy lifestyle including diet and exercise which she is doing a good job with especially in the setting of androgen deprivation in mitigating cardiovascular risks.   4.  Prognosis and goals of care: His disease is incurable although aggressive measures are warranted given his excellent response to therapy.  5.  Hypertension: Blood pressure is mildly elevated but has been close to normal range between visits.  6  Follow-up: In 4 months for repeat follow-up.   30  minutes were dedicated to this visit.  Time spent on outlining treatment choices and future, complications related to current therapy, discussing laboratory testing and discussion about prognosis.  Zola Button, MD 6/23/20222:32 PM

## 2021-04-27 ENCOUNTER — Telehealth: Payer: Self-pay | Admitting: *Deleted

## 2021-04-27 LAB — PROSTATE-SPECIFIC AG, SERUM (LABCORP): Prostate Specific Ag, Serum: <0.1 ng/mL (ref 0.0–4.0)

## 2021-04-27 NOTE — Telephone Encounter (Signed)
-----   Message from Wyatt Portela, MD sent at 04/27/2021  8:47 AM EDT ----- Please let him know his PSA is still low

## 2021-04-27 NOTE — Telephone Encounter (Signed)
PC to patient per Dr. Hazeline Junker request, PSA is <0.1, patient verbalizes understanding.

## 2021-05-08 ENCOUNTER — Other Ambulatory Visit: Payer: Self-pay | Admitting: Oncology

## 2021-05-10 ENCOUNTER — Other Ambulatory Visit: Payer: Self-pay | Admitting: Oncology

## 2021-06-19 ENCOUNTER — Other Ambulatory Visit: Payer: Self-pay | Admitting: *Deleted

## 2021-06-19 DIAGNOSIS — C61 Malignant neoplasm of prostate: Secondary | ICD-10-CM

## 2021-06-19 MED ORDER — ENZALUTAMIDE 40 MG PO TABS: ORAL_TABLET | ORAL | 0 refills | Status: DC

## 2021-06-19 NOTE — Telephone Encounter (Signed)
Contacted by [facility] - refill of Xtandi requested

## 2021-07-10 ENCOUNTER — Other Ambulatory Visit (HOSPITAL_COMMUNITY): Payer: Self-pay

## 2021-07-12 ENCOUNTER — Other Ambulatory Visit: Payer: Self-pay | Admitting: *Deleted

## 2021-07-12 DIAGNOSIS — C61 Malignant neoplasm of prostate: Secondary | ICD-10-CM

## 2021-07-12 MED ORDER — ENZALUTAMIDE 40 MG PO TABS: ORAL_TABLET | ORAL | 0 refills | Status: DC

## 2021-07-19 ENCOUNTER — Other Ambulatory Visit: Payer: Self-pay | Admitting: Oncology

## 2021-07-19 DIAGNOSIS — C61 Malignant neoplasm of prostate: Secondary | ICD-10-CM

## 2021-07-25 DIAGNOSIS — H40033 Anatomical narrow angle, bilateral: Secondary | ICD-10-CM | POA: Diagnosis not present

## 2021-07-25 DIAGNOSIS — H25813 Combined forms of age-related cataract, bilateral: Secondary | ICD-10-CM | POA: Diagnosis not present

## 2021-07-25 DIAGNOSIS — H04123 Dry eye syndrome of bilateral lacrimal glands: Secondary | ICD-10-CM | POA: Diagnosis not present

## 2021-07-25 DIAGNOSIS — H01026 Squamous blepharitis left eye, unspecified eyelid: Secondary | ICD-10-CM | POA: Diagnosis not present

## 2021-07-25 DIAGNOSIS — H40023 Open angle with borderline findings, high risk, bilateral: Secondary | ICD-10-CM | POA: Diagnosis not present

## 2021-07-25 DIAGNOSIS — H0288B Meibomian gland dysfunction left eye, upper and lower eyelids: Secondary | ICD-10-CM | POA: Diagnosis not present

## 2021-07-25 DIAGNOSIS — H02831 Dermatochalasis of right upper eyelid: Secondary | ICD-10-CM | POA: Diagnosis not present

## 2021-07-25 DIAGNOSIS — H0288A Meibomian gland dysfunction right eye, upper and lower eyelids: Secondary | ICD-10-CM | POA: Diagnosis not present

## 2021-07-25 DIAGNOSIS — H02834 Dermatochalasis of left upper eyelid: Secondary | ICD-10-CM | POA: Diagnosis not present

## 2021-07-25 DIAGNOSIS — H01023 Squamous blepharitis right eye, unspecified eyelid: Secondary | ICD-10-CM | POA: Diagnosis not present

## 2021-07-25 DIAGNOSIS — H11823 Conjunctivochalasis, bilateral: Secondary | ICD-10-CM | POA: Diagnosis not present

## 2021-08-10 ENCOUNTER — Telehealth: Payer: Self-pay | Admitting: Oncology

## 2021-08-10 NOTE — Telephone Encounter (Signed)
Called patient regarding upcoming appointments, rescheduled appointment per patient's request.

## 2021-08-23 ENCOUNTER — Inpatient Hospital Stay: Payer: Medicare Other

## 2021-08-23 ENCOUNTER — Inpatient Hospital Stay: Payer: Medicare Other | Admitting: Oncology

## 2021-08-29 ENCOUNTER — Other Ambulatory Visit: Payer: Self-pay | Admitting: *Deleted

## 2021-08-29 DIAGNOSIS — C61 Malignant neoplasm of prostate: Secondary | ICD-10-CM

## 2021-08-29 MED ORDER — ENZALUTAMIDE 40 MG PO TABS: ORAL_TABLET | ORAL | 0 refills | Status: DC

## 2021-08-30 DIAGNOSIS — C61 Malignant neoplasm of prostate: Secondary | ICD-10-CM | POA: Diagnosis not present

## 2021-09-04 ENCOUNTER — Telehealth: Payer: Self-pay

## 2021-09-04 NOTE — Telephone Encounter (Signed)
Oral Oncology Patient Advocate Encounter  Met patient in lobby room to complete re-enrollment application for [place] in an effort to reduce patient's out of pocket expense for Xtandi to $0.    Application completed and faxed to [phone].   Xtandi patient assistance phone number for follow up is [phone].   This encounter will be updated until final determination.   [facility] [facility] [place] Phone [phone] Fax [phone] 09/04/2021 4:39 PM

## 2021-09-06 DIAGNOSIS — C775 Secondary and unspecified malignant neoplasm of intrapelvic lymph nodes: Secondary | ICD-10-CM | POA: Diagnosis not present

## 2021-09-06 DIAGNOSIS — C61 Malignant neoplasm of prostate: Secondary | ICD-10-CM | POA: Diagnosis not present

## 2021-09-06 DIAGNOSIS — R351 Nocturia: Secondary | ICD-10-CM | POA: Diagnosis not present

## 2021-09-14 ENCOUNTER — Inpatient Hospital Stay (HOSPITAL_BASED_OUTPATIENT_CLINIC_OR_DEPARTMENT_OTHER): Payer: Medicare Other | Admitting: Oncology

## 2021-09-14 ENCOUNTER — Other Ambulatory Visit: Payer: Self-pay

## 2021-09-14 ENCOUNTER — Inpatient Hospital Stay: Payer: Medicare Other | Attending: Oncology

## 2021-09-14 VITALS — BP 132/72 | HR 77 | Temp 98.7°F | Resp 17 | Ht 70.0 in | Wt 167.6 lb

## 2021-09-14 DIAGNOSIS — C61 Malignant neoplasm of prostate: Secondary | ICD-10-CM | POA: Insufficient documentation

## 2021-09-14 DIAGNOSIS — I10 Essential (primary) hypertension: Secondary | ICD-10-CM | POA: Diagnosis not present

## 2021-09-14 DIAGNOSIS — R591 Generalized enlarged lymph nodes: Secondary | ICD-10-CM | POA: Diagnosis not present

## 2021-09-14 LAB — CBC WITH DIFFERENTIAL (CANCER CENTER ONLY)
Abs Immature Granulocytes: 0.01 10*3/uL (ref 0.00–0.07)
Basophils Absolute: 0 10*3/uL (ref 0.0–0.1)
Basophils Relative: 0 %
Eosinophils Absolute: 0.1 10*3/uL (ref 0.0–0.5)
Eosinophils Relative: 3 %
HCT: 37.1 % — ABNORMAL LOW (ref 39.0–52.0)
Hemoglobin: 12.8 g/dL — ABNORMAL LOW (ref 13.0–17.0)
Immature Granulocytes: 0 %
Lymphocytes Relative: 37 %
Lymphs Abs: 1.8 10*3/uL (ref 0.7–4.0)
MCH: 33.6 pg (ref 26.0–34.0)
MCHC: 34.5 g/dL (ref 30.0–36.0)
MCV: 97.4 fL (ref 80.0–100.0)
Monocytes Absolute: 0.3 10*3/uL (ref 0.1–1.0)
Monocytes Relative: 7 %
Neutro Abs: 2.6 10*3/uL (ref 1.7–7.7)
Neutrophils Relative %: 53 %
Platelet Count: 210 10*3/uL (ref 150–400)
RBC: 3.81 MIL/uL — ABNORMAL LOW (ref 4.22–5.81)
RDW: 12.2 % (ref 11.5–15.5)
WBC Count: 5 10*3/uL (ref 4.0–10.5)
nRBC: 0 % (ref 0.0–0.2)

## 2021-09-14 LAB — CMP (CANCER CENTER ONLY)
ALT: 9 U/L (ref 0–44)
AST: 13 U/L — ABNORMAL LOW (ref 15–41)
Albumin: 3.6 g/dL (ref 3.5–5.0)
Alkaline Phosphatase: 49 U/L (ref 38–126)
Anion gap: 9 (ref 5–15)
BUN: 15 mg/dL (ref 8–23)
CO2: 25 mmol/L (ref 22–32)
Calcium: 8.7 mg/dL — ABNORMAL LOW (ref 8.9–10.3)
Chloride: 105 mmol/L (ref 98–111)
Creatinine: 0.77 mg/dL (ref 0.61–1.24)
GFR, Estimated: >60 mL/min (ref >60)
Glucose, Bld: 116 mg/dL — ABNORMAL HIGH (ref 70–99)
Potassium: 3.9 mmol/L (ref 3.5–5.1)
Sodium: 139 mmol/L (ref 135–145)
Total Bilirubin: 0.3 mg/dL (ref 0.3–1.2)
Total Protein: 6.3 g/dL — ABNORMAL LOW (ref 6.5–8.1)

## 2021-09-14 NOTE — Progress Notes (Signed)
Hematology and Oncology Follow Up Visit  [name] [id number] 1951/04/19 70 y.o. 09/14/2021 2:37 PM Pcp, NoNo ref. provider found   Principle Diagnosis: 70 year old man with castration-sensitive advanced prostate cancer with lymphadenopathy diagnosed in 2019.  He was found to have PSA 118 and Gleason score 4+5 = 9 at the time of diagnosis.  Prior Therapy:  Prostate biopsy in February 2019 which showed Gleason score 4+5 = 9 and 2 cores with 6 other cores of Gleason score 8.     Current therapy:  Eligard every 6 months under the care of Dr. Gloriann Loan.  Xtandi 160 mg daily started October 2019.   Interim History: [name] is here for a follow-up evaluation.  Since the last visit, he reports no major changes in his health.  He denies any recent hospitalizations or illnesses.  He denies any bone pain or pathological fractures.  He denies complications related to [facility].  He had denies any excessive fatigue, tiredness or seizure activity.         Medications: reviewed without changes. Current Outpatient Medications  Medication Sig Dispense Refill   acetaminophen (TYLENOL) 500 MG tablet Take 500-1,000 mg by mouth every 6 (six) hours as needed (for pain or headaches).     BLACK PEPPER-TURMERIC PO Take 1 tablet by mouth daily.     enzalutamide (XTANDI) 40 MG tablet Take 4 tablets (160mg ) by mouth once daily as directed by physician. 120 tablet 0   Leuprolide Acetate (LUPRON IJ) Inject 1 Dose as directed every 6 (six) months.     Multiple Vitamin (MULTIVITAMIN WITH MINERALS) TABS tablet Take 2 tablets by mouth daily.     Multiple Vitamin (MULTIVITAMIN) tablet Take 2 tablets by mouth 2 (two) times daily. "bone up" calcium enriched multivitamin     Multiple Vitamins-Minerals (PRESERVISION AREDS) CAPS Take 1 capsule by mouth daily.     naproxen (NAPROSYN) 500 MG tablet Take 1 tablet (500 mg total) by mouth 2 (two) times daily with a meal. (Patient taking differently: Take 500 mg by  mouth 2 (two) times daily as needed. ) 40 tablet 1   OVER THE COUNTER MEDICATION Take 1 tablet by mouth daily as needed (energy). "blood builder" iron complex     oxyCODONE (OXY IR/ROXICODONE) 5 MG immediate release tablet Take 1-2 tablets (5-10 mg total) by mouth every 6 (six) hours as needed for moderate pain, severe pain or breakthrough pain. (Patient not taking: Reported on 12/30/2019) 30 tablet 0   tamsulosin (FLOMAX) 0.4 MG CAPS capsule Take 1 capsule (0.4 mg total) by mouth daily. 30 capsule 0   Tryptophan 500 MG TABS Take 500 mg by mouth at bedtime as needed (sleep).     No current facility-administered medications for this visit.     Allergies: No Known Allergies      Physical Exam:    Blood pressure 132/72, pulse 77, temperature 98.7 F (37.1 C), temperature source Temporal, resp. rate 17, height 5\' 10"  (1.778 m), weight 167 lb 9.6 oz (76 kg), SpO2 99 %.        ECOG: 0    General appearance: Comfortable appearing without any discomfort Head: Normocephalic without any trauma Oropharynx: Mucous membranes are moist and pink without any thrush or ulcers. Eyes: Pupils are equal and round reactive to light. Lymph nodes: No cervical, supraclavicular, inguinal or axillary lymphadenopathy.   Heart:regular rate and rhythm.  S1 and S2 without leg edema. Lung: Clear without any rhonchi or wheezes.  No dullness to percussion. Abdomin: Soft, nontender,  nondistended with good bowel sounds.  No hepatosplenomegaly. Musculoskeletal: No joint deformity or effusion.  Full range of motion noted. Neurological: No deficits noted on motor, sensory and deep tendon reflex exam. Skin: No petechial rash or dryness.  Appeared moist.                Lab Results: Lab Results  Component Value Date   WBC 6.1 04/26/2021   HGB 13.0 04/26/2021   HCT 38.3 (L) 04/26/2021   MCV 98.5 04/26/2021   PLT 254 04/26/2021     Chemistry      Component Value Date/Time   NA 141 04/26/2021  1430   K 3.9 04/26/2021 1430   CL 107 04/26/2021 1430   CO2 23 04/26/2021 1430   BUN 15 04/26/2021 1430   CREATININE 0.81 04/26/2021 1430      Component Value Date/Time   CALCIUM 9.4 04/26/2021 1430   ALKPHOS 56 04/26/2021 1430   AST 15 04/26/2021 1430   ALT 14 04/26/2021 1430   BILITOT 0.3 04/26/2021 1430      Results for Phillip, Brown (MRN [id number]) as of 09/14/2021 14:39  Ref. Range 12/28/2020 12:52 04/26/2021 14:30  Prostate Specific Ag, Serum Latest Ref Range: 0.0 - 4.0 ng/mL <0.1 <0.1          Impression and Plan:  70 year old man with:   1.  Advanced prostate cancer with disease involving lymph nodes diagnosed in 2019.  He has castration-sensitive disease at this time.  His disease status was updated at this time and treatment choices were reviewed.  His PSA is currently undetectable on Xtandi.  Risks and benefits of continuing this treatment were discussed.  Treatment options including Taxotere chemotherapy and others were reiterated.  He is agreeable to continue at this time.   2.  Androgen deprivation: I recommended continuing this indefinitely.  He is receiving that under the care of [facility].  3.  Fitness and exercise considerations: He continues to do well from that aspect and helped his overall cardiovascular health.   4.  Prognosis and goals of care: Therapy remains palliative though responses are warranted given his reasonable performance status.  5.  Hypertension: His blood pressure is within normal range and will continue to monitor on Xtandi.  6  Follow-up: He will return in 4 months for a follow-up visit.   30  minutes were spent on this encounter.  Time was dedicated to reviewing disease status, treatment choices and outlining future plan of care.  Zola Button, MD 11/11/20222:37 PM

## 2021-09-15 LAB — PROSTATE-SPECIFIC AG, SERUM (LABCORP): Prostate Specific Ag, Serum: <0.1 ng/mL (ref 0.0–4.0)

## 2021-09-17 ENCOUNTER — Telehealth: Payer: Self-pay | Admitting: *Deleted

## 2021-09-17 NOTE — Telephone Encounter (Signed)
Communicated lab value per Dr Alen Blew.  Patient denied having any further questions at this time.

## 2021-09-17 NOTE — Telephone Encounter (Signed)
-----   Message from Wyatt Portela, MD sent at 09/17/2021  8:00 AM EST ----- Please let him know his PSA is still low

## 2021-09-28 ENCOUNTER — Other Ambulatory Visit: Payer: Self-pay | Admitting: Oncology

## 2021-09-28 DIAGNOSIS — C61 Malignant neoplasm of prostate: Secondary | ICD-10-CM

## 2021-10-04 DIAGNOSIS — Z23 Encounter for immunization: Secondary | ICD-10-CM | POA: Diagnosis not present

## 2021-10-15 ENCOUNTER — Other Ambulatory Visit: Payer: Self-pay | Admitting: Oncology

## 2021-10-15 DIAGNOSIS — C61 Malignant neoplasm of prostate: Secondary | ICD-10-CM

## 2021-10-16 ENCOUNTER — Other Ambulatory Visit: Payer: Self-pay | Admitting: Oncology

## 2021-10-16 DIAGNOSIS — C61 Malignant neoplasm of prostate: Secondary | ICD-10-CM

## 2021-10-16 MED ORDER — ENZALUTAMIDE 40 MG PO TABS: 160.0000 mg | ORAL_TABLET | Freq: Every day | ORAL | 1 refills | Status: DC

## 2021-11-15 ENCOUNTER — Telehealth: Payer: Self-pay | Admitting: Oncology

## 2021-11-15 NOTE — Telephone Encounter (Signed)
Sch per 1/3 los, left msg

## 2021-11-21 ENCOUNTER — Other Ambulatory Visit (HOSPITAL_COMMUNITY): Payer: Self-pay

## 2021-12-11 NOTE — Telephone Encounter (Signed)
Patient is approved for Xtandi at no cost from [employer] 12/05/2021-11/03/22.  [name] uses [facility] Phone [phone] Fax [phone] 12/11/2021 10:53 AM

## 2022-01-03 ENCOUNTER — Other Ambulatory Visit: Payer: Self-pay | Admitting: Oncology

## 2022-01-03 DIAGNOSIS — C61 Malignant neoplasm of prostate: Secondary | ICD-10-CM

## 2022-01-11 ENCOUNTER — Other Ambulatory Visit: Payer: Medicare Other

## 2022-01-11 ENCOUNTER — Ambulatory Visit: Payer: Medicare Other | Admitting: Oncology

## 2022-01-11 ENCOUNTER — Inpatient Hospital Stay (HOSPITAL_BASED_OUTPATIENT_CLINIC_OR_DEPARTMENT_OTHER): Payer: Medicare Other | Admitting: Oncology

## 2022-01-11 ENCOUNTER — Inpatient Hospital Stay: Payer: Medicare Other | Attending: Oncology

## 2022-01-11 ENCOUNTER — Other Ambulatory Visit: Payer: Self-pay

## 2022-01-11 VITALS — BP 142/71 | HR 85 | Temp 97.7°F | Resp 18 | Ht 70.0 in | Wt 166.1 lb

## 2022-01-11 DIAGNOSIS — C61 Malignant neoplasm of prostate: Secondary | ICD-10-CM | POA: Diagnosis not present

## 2022-01-11 DIAGNOSIS — I10 Essential (primary) hypertension: Secondary | ICD-10-CM | POA: Insufficient documentation

## 2022-01-11 LAB — CMP (CANCER CENTER ONLY)
ALT: 13 U/L (ref 0–44)
AST: 14 U/L — ABNORMAL LOW (ref 15–41)
Albumin: 4 g/dL (ref 3.5–5.0)
Alkaline Phosphatase: 48 U/L (ref 38–126)
Anion gap: 7 (ref 5–15)
BUN: 17 mg/dL (ref 8–23)
CO2: 27 mmol/L (ref 22–32)
Calcium: 9.1 mg/dL (ref 8.9–10.3)
Chloride: 106 mmol/L (ref 98–111)
Creatinine: 0.77 mg/dL (ref 0.61–1.24)
GFR, Estimated: >60 mL/min (ref >60)
Glucose, Bld: 123 mg/dL — ABNORMAL HIGH (ref 70–99)
Potassium: 4 mmol/L (ref 3.5–5.1)
Sodium: 140 mmol/L (ref 135–145)
Total Bilirubin: 0.3 mg/dL (ref 0.3–1.2)
Total Protein: 6.3 g/dL — ABNORMAL LOW (ref 6.5–8.1)

## 2022-01-11 LAB — CBC WITH DIFFERENTIAL (CANCER CENTER ONLY)
Abs Immature Granulocytes: 0.02 10*3/uL (ref 0.00–0.07)
Basophils Absolute: 0 10*3/uL (ref 0.0–0.1)
Basophils Relative: 1 %
Eosinophils Absolute: 0.1 10*3/uL (ref 0.0–0.5)
Eosinophils Relative: 2 %
HCT: 38.2 % — ABNORMAL LOW (ref 39.0–52.0)
Hemoglobin: 13.3 g/dL (ref 13.0–17.0)
Immature Granulocytes: 0 %
Lymphocytes Relative: 33 %
Lymphs Abs: 1.7 10*3/uL (ref 0.7–4.0)
MCH: 33.4 pg (ref 26.0–34.0)
MCHC: 34.8 g/dL (ref 30.0–36.0)
MCV: 96 fL (ref 80.0–100.0)
Monocytes Absolute: 0.4 10*3/uL (ref 0.1–1.0)
Monocytes Relative: 7 %
Neutro Abs: 3 10*3/uL (ref 1.7–7.7)
Neutrophils Relative %: 57 %
Platelet Count: 214 10*3/uL (ref 150–400)
RBC: 3.98 MIL/uL — ABNORMAL LOW (ref 4.22–5.81)
RDW: 12.5 % (ref 11.5–15.5)
WBC Count: 5.1 10*3/uL (ref 4.0–10.5)
nRBC: 0 % (ref 0.0–0.2)

## 2022-01-11 NOTE — Progress Notes (Signed)
Hematology and Oncology Follow Up Visit ? ?Phillip Brown ?[id number] ?17-Feb-1951 71 y.o. ?01/11/2022 12:36 PM ?Pcp, NoNo ref. provider found  ? ?Principle Diagnosis: 71 year old man with prostate cancer diagnosed in 2019.  He presented with castration-sensitive advanced with lymphadenopathy, PSA 118 and Gleason score 4+5 = 9.  ? ?Prior Therapy: ? ?Prostate biopsy in February 2019 which showed Gleason score 4+5 = 9 and 2 cores with 6 other cores of Gleason score 8.   ? ? ?Current therapy: ? ?Eligard every 6 months under the care of Dr. Gloriann Loan. ? ?Xtandi 160 mg daily started October 2019. ? ? ?Interim History: Phillip Brown returns today for a follow-up visit.  Since last visit, he reports no major changes in his health.  He denies any complications related to [facility] and continues to take it regularly.  He denies any nausea, fatigue or dyspnea on exertion.  His performance status quality of life remained excellent. ? ? ? ? ? ? ? ? ?Medications: Updated on review. ?Current Outpatient Medications  ?Medication Sig Dispense Refill  ? acetaminophen (TYLENOL) 500 MG tablet Take 500-1,000 mg by mouth every 6 (six) hours as needed (for pain or headaches).    ? BLACK PEPPER-TURMERIC PO Take 1 tablet by mouth daily.    ? Leuprolide Acetate (LUPRON IJ) Inject 1 Dose as directed every 6 (six) months.    ? Multiple Vitamin (MULTIVITAMIN WITH MINERALS) TABS tablet Take 2 tablets by mouth daily.    ? Multiple Vitamin (MULTIVITAMIN) tablet Take 2 tablets by mouth 2 (two) times daily. "bone up" calcium enriched multivitamin    ? Multiple Vitamins-Minerals (PRESERVISION AREDS) CAPS Take 1 capsule by mouth daily.    ? naproxen (NAPROSYN) 500 MG tablet Take 1 tablet (500 mg total) by mouth 2 (two) times daily with a meal. (Patient taking differently: Take 500 mg by mouth 2 (two) times daily as needed. ) 40 tablet 1  ? OVER THE COUNTER MEDICATION Take 1 tablet by mouth daily as needed (energy). "blood builder" iron complex    ? oxyCODONE (OXY  IR/ROXICODONE) 5 MG immediate release tablet Take 1-2 tablets (5-10 mg total) by mouth every 6 (six) hours as needed for moderate pain, severe pain or breakthrough pain. (Patient not taking: Reported on 12/30/2019) 30 tablet 0  ? tamsulosin (FLOMAX) 0.4 MG CAPS capsule Take 1 capsule (0.4 mg total) by mouth daily. 30 capsule 0  ? Tryptophan 500 MG TABS Take 500 mg by mouth at bedtime as needed (sleep).    ? XTANDI 40 MG tablet Take 4 tablets (160 mg total) by mouth daily. 120 tablet 0  ? ?No current facility-administered medications for this visit.  ? ? ? ?Allergies: No Known Allergies ? ? ? ? ? ?Physical Exam: ? ? ? ?Blood pressure (!) 142/71, pulse 85, temperature 97.7 ?F (36.5 ?C), temperature source Tympanic, resp. rate 18, height '5\' 10"'$  (1.778 m), weight 166 lb 1.6 oz (75.3 kg), SpO2 99 %. ? ? ? ? ? ? ? ? ?ECOG: 0 ? ? ?General appearance: Alert, awake without any distress. ?Head: Atraumatic without abnormalities ?Oropharynx: Without any thrush or ulcers. ?Eyes: No scleral icterus. ?Lymph nodes: No lymphadenopathy noted in the cervical, supraclavicular, or axillary nodes ?Heart:regular rate and rhythm, without any murmurs or gallops.   ?Lung: Clear to auscultation without any rhonchi, wheezes or dullness to percussion. ?Abdomin: Soft, nontender without any shifting dullness or ascites. ?Musculoskeletal: No clubbing or cyanosis. ?Neurological: No motor or sensory deficits. ?Skin: No rashes or lesions. ? ? ? ? ? ? ? ? ? ? ? ? ? ? ? ?  Lab Results: ?Lab Results  ?Component Value Date  ? WBC 5.0 09/14/2021  ? HGB 12.8 (L) 09/14/2021  ? HCT 37.1 (L) 09/14/2021  ? MCV 97.4 09/14/2021  ? PLT 210 09/14/2021  ? ?  Chemistry   ?   ?Component Value Date/Time  ? NA 139 09/14/2021 1421  ? K 3.9 09/14/2021 1421  ? CL 105 09/14/2021 1421  ? CO2 25 09/14/2021 1421  ? BUN 15 09/14/2021 1421  ? CREATININE 0.77 09/14/2021 1421  ?    ?Component Value Date/Time  ? CALCIUM 8.7 (L) 09/14/2021 1421  ? ALKPHOS 49 09/14/2021 1421  ? AST 13  (L) 09/14/2021 1421  ? ALT 9 09/14/2021 1421  ? BILITOT 0.3 09/14/2021 1421  ?  ? ? ? ? ? ? Latest Reference Range & Units 04/26/21 14:30  ?Prostate Specific Ag, Serum 0.0 - 4.0 ng/mL <0.1  ? ? ? ? ? ? ?Impression and Plan: ? ?71 year old man with: ?  ?1.  Advanced prostate cancer with lymphadenopathy diagnosed in 2019.  He has castration-sensitive disease. ? ?He continues to tolerate Xtandi without any major complaints.  His PSA continues to show excellent response and currently undetectable.  Risks and benefits of continuing this treatment long-term were discussed.  Alternative treatment options such as Taxotere chemotherapy among others were reiterated.  He is agreeable to continue at this time. ?  ?2.  Androgen deprivation: This is given at [facility] and I recommended continuing indefinitely. ? ?3.  Fitness and exercise considerations: He remains active and continues to exercise regularly. ?  ?4.  Prognosis and goals of care: His disease is incurable although aggressive measures are warranted given his excellent response and performance status. ? ?5.  Hypertension: His blood pressure is within normal range and will continue to monitor on Xtandi. ? ?6  Follow-up: In 4 months for repeat evaluation. ?  ?30  minutes were dedicated to this visit.  The time spent on reviewing laboratory data, disease status update outlining future plan of care discussion. ? ?Zola Button, MD ?3/10/202312:36 PM ?

## 2022-01-12 LAB — PROSTATE-SPECIFIC AG, SERUM (LABCORP): Prostate Specific Ag, Serum: <0.1 ng/mL (ref 0.0–4.0)

## 2022-01-14 ENCOUNTER — Telehealth: Payer: Self-pay

## 2022-01-14 NOTE — Telephone Encounter (Signed)
Called patient  to advise that PSA results remain low, per Dr. Alen Blew. ?

## 2022-02-26 DIAGNOSIS — C61 Malignant neoplasm of prostate: Secondary | ICD-10-CM | POA: Diagnosis not present

## 2022-03-05 DIAGNOSIS — C61 Malignant neoplasm of prostate: Secondary | ICD-10-CM | POA: Diagnosis not present

## 2022-03-05 DIAGNOSIS — R3121 Asymptomatic microscopic hematuria: Secondary | ICD-10-CM | POA: Diagnosis not present

## 2022-03-05 DIAGNOSIS — C775 Secondary and unspecified malignant neoplasm of intrapelvic lymph nodes: Secondary | ICD-10-CM | POA: Diagnosis not present

## 2022-04-05 DIAGNOSIS — C61 Malignant neoplasm of prostate: Secondary | ICD-10-CM | POA: Diagnosis not present

## 2022-04-05 DIAGNOSIS — C775 Secondary and unspecified malignant neoplasm of intrapelvic lymph nodes: Secondary | ICD-10-CM | POA: Diagnosis not present

## 2022-05-16 ENCOUNTER — Other Ambulatory Visit: Payer: Self-pay

## 2022-05-16 ENCOUNTER — Inpatient Hospital Stay: Payer: Medicare Other | Attending: Oncology

## 2022-05-16 ENCOUNTER — Inpatient Hospital Stay (HOSPITAL_BASED_OUTPATIENT_CLINIC_OR_DEPARTMENT_OTHER): Payer: Medicare Other | Admitting: Oncology

## 2022-05-16 VITALS — BP 136/71 | HR 71 | Temp 98.1°F | Resp 17 | Ht 70.0 in | Wt 167.1 lb

## 2022-05-16 DIAGNOSIS — C61 Malignant neoplasm of prostate: Secondary | ICD-10-CM | POA: Insufficient documentation

## 2022-05-16 DIAGNOSIS — I10 Essential (primary) hypertension: Secondary | ICD-10-CM | POA: Insufficient documentation

## 2022-05-16 LAB — CMP (CANCER CENTER ONLY)
ALT: 10 U/L (ref 0–44)
AST: 15 U/L (ref 15–41)
Albumin: 3.9 g/dL (ref 3.5–5.0)
Alkaline Phosphatase: 48 U/L (ref 38–126)
Anion gap: 6 (ref 5–15)
BUN: 16 mg/dL (ref 8–23)
CO2: 26 mmol/L (ref 22–32)
Calcium: 9.2 mg/dL (ref 8.9–10.3)
Chloride: 106 mmol/L (ref 98–111)
Creatinine: 0.79 mg/dL (ref 0.61–1.24)
GFR, Estimated: >60 mL/min (ref >60)
Glucose, Bld: 103 mg/dL — ABNORMAL HIGH (ref 70–99)
Potassium: 4 mmol/L (ref 3.5–5.1)
Sodium: 138 mmol/L (ref 135–145)
Total Bilirubin: 0.3 mg/dL (ref 0.3–1.2)
Total Protein: 6.3 g/dL — ABNORMAL LOW (ref 6.5–8.1)

## 2022-05-16 LAB — CBC WITH DIFFERENTIAL (CANCER CENTER ONLY)
Abs Immature Granulocytes: 0.01 10*3/uL (ref 0.00–0.07)
Basophils Absolute: 0 10*3/uL (ref 0.0–0.1)
Basophils Relative: 0 %
Eosinophils Absolute: 0.1 10*3/uL (ref 0.0–0.5)
Eosinophils Relative: 3 %
HCT: 37.5 % — ABNORMAL LOW (ref 39.0–52.0)
Hemoglobin: 13.1 g/dL (ref 13.0–17.0)
Immature Granulocytes: 0 %
Lymphocytes Relative: 38 %
Lymphs Abs: 1.8 10*3/uL (ref 0.7–4.0)
MCH: 34.3 pg — ABNORMAL HIGH (ref 26.0–34.0)
MCHC: 34.9 g/dL (ref 30.0–36.0)
MCV: 98.2 fL (ref 80.0–100.0)
Monocytes Absolute: 0.5 10*3/uL (ref 0.1–1.0)
Monocytes Relative: 10 %
Neutro Abs: 2.3 10*3/uL (ref 1.7–7.7)
Neutrophils Relative %: 49 %
Platelet Count: 178 10*3/uL (ref 150–400)
RBC: 3.82 MIL/uL — ABNORMAL LOW (ref 4.22–5.81)
RDW: 12.2 % (ref 11.5–15.5)
WBC Count: 4.8 10*3/uL (ref 4.0–10.5)
nRBC: 0 % (ref 0.0–0.2)

## 2022-05-16 NOTE — Progress Notes (Signed)
Hematology and Oncology Follow Up Visit  Phillip Brown [id number] 1951/04/23 71 y.o. 05/16/2022 9:42 AM Pcp, NoNo ref. provider found   Principle Diagnosis: 71 year old man with castration-sensitive advanced prostate cancer diagnosed in 2019.  He presented with with lymphadenopathy, PSA 118 and Gleason score 4+5 = 9.   Prior Therapy:  Prostate biopsy in February 2019 which showed Gleason score 4+5 = 9 and 2 cores with 6 other cores of Gleason score 8.     Current therapy:  Eligard every 6 months under the care of Dr. Gloriann Loan.  Xtandi 160 mg daily started October 2019.   Interim History: Phillip Brown returns today for repeat follow-up.  Since last visit, he reports no major changes in his health.  He has tolerated Xtandi without any complications.  He denies any nausea, vomiting or abdominal pain.  He denies any changes in his bowel habits or mental status changes.  He has reported fatigue which has not changed dramatically at this time.  Not interfering with his function currently.         Medications: Reviewed without changes. Current Outpatient Medications  Medication Sig Dispense Refill   acetaminophen (TYLENOL) 500 MG tablet Take 500-1,000 mg by mouth every 6 (six) hours as needed (for pain or headaches).     BLACK PEPPER-TURMERIC PO Take 1 tablet by mouth daily.     Leuprolide Acetate (LUPRON IJ) Inject 1 Dose as directed every 6 (six) months.     Multiple Vitamin (MULTIVITAMIN WITH MINERALS) TABS tablet Take 2 tablets by mouth daily.     Multiple Vitamin (MULTIVITAMIN) tablet Take 2 tablets by mouth 2 (two) times daily. "bone up" calcium enriched multivitamin     Multiple Vitamins-Minerals (PRESERVISION AREDS) CAPS Take 1 capsule by mouth daily.     naproxen (NAPROSYN) 500 MG tablet Take 1 tablet (500 mg total) by mouth 2 (two) times daily with a meal. (Patient taking differently: Take 500 mg by mouth 2 (two) times daily as needed. ) 40 tablet 1   OVER THE COUNTER MEDICATION  Take 1 tablet by mouth daily as needed (energy). "blood builder" iron complex     oxyCODONE (OXY IR/ROXICODONE) 5 MG immediate release tablet Take 1-2 tablets (5-10 mg total) by mouth every 6 (six) hours as needed for moderate pain, severe pain or breakthrough pain. (Patient not taking: Reported on 12/30/2019) 30 tablet 0   tamsulosin (FLOMAX) 0.4 MG CAPS capsule Take 1 capsule (0.4 mg total) by mouth daily. 30 capsule 0   Tryptophan 500 MG TABS Take 500 mg by mouth at bedtime as needed (sleep).     XTANDI 40 MG tablet Take 4 tablets (160 mg total) by mouth daily. 120 tablet 0   No current facility-administered medications for this visit.     Allergies: No Known Allergies      Physical Exam:       Blood pressure 136/71, pulse 71, temperature 98.1 F (36.7 C), temperature source Temporal, resp. rate 17, height '5\' 10"'$  (1.778 m), weight 167 lb 1.6 oz (75.8 kg), SpO2 98 %.       ECOG: 0   General appearance: Comfortable appearing without any discomfort Head: Normocephalic without any trauma Oropharynx: Mucous membranes are moist and pink without any thrush or ulcers. Eyes: Pupils are equal and round reactive to light. Lymph nodes: No cervical, supraclavicular, inguinal or axillary lymphadenopathy.   Heart:regular rate and rhythm.  S1 and S2 without leg edema. Lung: Clear without any rhonchi or wheezes.  No dullness to  percussion. Abdomin: Soft, nontender, nondistended with good bowel sounds.  No hepatosplenomegaly. Musculoskeletal: No joint deformity or effusion.  Full range of motion noted. Neurological: No deficits noted on motor, sensory and deep tendon reflex exam. Skin: No petechial rash or dryness.  Appeared moist.                  Lab Results: Lab Results  Component Value Date   WBC 4.8 05/16/2022   HGB 13.1 05/16/2022   HCT 37.5 (L) 05/16/2022   MCV 98.2 05/16/2022   PLT 178 05/16/2022     Chemistry      Component Value Date/Time   NA 140  01/11/2022 1230   K 4.0 01/11/2022 1230   CL 106 01/11/2022 1230   CO2 27 01/11/2022 1230   BUN 17 01/11/2022 1230   CREATININE 0.77 01/11/2022 1230      Component Value Date/Time   CALCIUM 9.1 01/11/2022 1230   ALKPHOS 48 01/11/2022 1230   AST 14 (L) 01/11/2022 1230   ALT 13 01/11/2022 1230   BILITOT 0.3 01/11/2022 1230        Latest Reference Range & Units 09/14/21 14:21 01/11/22 12:30  Prostate Specific Ag, Serum 0.0 - 4.0 ng/mL <0.1 <0.1         Impression and Plan:  71 year old man with:   1.  Castration-sensitive advanced prostate cancer with lymphadenopathy diagnosed in 2019.    He is currently on Xtandi with excellent PSA response.  Risks and benefits of continuing this treatment long-term were reviewed.  Complication clinic hypertension, fatigue and seizure were reviewed.  At this time he is willing to continue.  Alternative treatment options including systemic chemotherapy and PARP inhibitors could be considered if he develop progression of disease.  Laboratory data from today reviewed showed hemoglobin to be within normal range.   2.  Androgen deprivation: He is on Eligard every 6 months under the care of [facility].  Recommended continuing this indefinitely.  3.  Fitness and exercise considerations: Continue to recommend cardiovascular exercise as well as resistance training which can help with his fatigue.  He remains active exercising 30 minutes a day.   4.  Prognosis and goals of care: Therapy remains palliative although aggressive measures are warranted.  5.  Hypertension: No issues reported with his blood pressure.  Blood pressure is within normal range.  6  Follow-up: In 4 months for repeat evaluation.   30  minutes were spent on this encounter.  The time was dedicated to reviewing his disease status, treatment choices and outlining future plan of care review.  Zola Button, MD 7/13/20239:42 AM

## 2022-05-18 LAB — PROSTATE-SPECIFIC AG, SERUM (LABCORP): Prostate Specific Ag, Serum: <0.1 ng/mL (ref 0.0–4.0)

## 2022-05-20 ENCOUNTER — Telehealth: Payer: Self-pay | Admitting: *Deleted

## 2022-05-20 NOTE — Telephone Encounter (Signed)
PC to patient, informed him of Dr Shadad's message below, he verbalizes understanding. 

## 2022-05-20 NOTE — Telephone Encounter (Signed)
-----   Message from Wyatt Portela, MD sent at 05/20/2022  8:32 AM EDT ----- Please let him know his PSA is still low

## 2022-06-03 ENCOUNTER — Other Ambulatory Visit: Payer: Self-pay | Admitting: Oncology

## 2022-06-03 DIAGNOSIS — C61 Malignant neoplasm of prostate: Secondary | ICD-10-CM

## 2022-07-29 DIAGNOSIS — H2513 Age-related nuclear cataract, bilateral: Secondary | ICD-10-CM | POA: Diagnosis not present

## 2022-07-29 DIAGNOSIS — H40033 Anatomical narrow angle, bilateral: Secondary | ICD-10-CM | POA: Diagnosis not present

## 2022-07-29 DIAGNOSIS — H40023 Open angle with borderline findings, high risk, bilateral: Secondary | ICD-10-CM | POA: Diagnosis not present

## 2022-07-29 DIAGNOSIS — H0288A Meibomian gland dysfunction right eye, upper and lower eyelids: Secondary | ICD-10-CM | POA: Diagnosis not present

## 2022-07-29 DIAGNOSIS — H11823 Conjunctivochalasis, bilateral: Secondary | ICD-10-CM | POA: Diagnosis not present

## 2022-07-29 DIAGNOSIS — H04123 Dry eye syndrome of bilateral lacrimal glands: Secondary | ICD-10-CM | POA: Diagnosis not present

## 2022-07-29 DIAGNOSIS — H0288B Meibomian gland dysfunction left eye, upper and lower eyelids: Secondary | ICD-10-CM | POA: Diagnosis not present

## 2022-07-29 DIAGNOSIS — H02831 Dermatochalasis of right upper eyelid: Secondary | ICD-10-CM | POA: Diagnosis not present

## 2022-07-29 DIAGNOSIS — H02834 Dermatochalasis of left upper eyelid: Secondary | ICD-10-CM | POA: Diagnosis not present

## 2022-08-20 ENCOUNTER — Telehealth: Payer: Self-pay | Admitting: Oncology

## 2022-08-20 NOTE — Telephone Encounter (Signed)
Called patient regarding upcoming November appointments, patient is notified. 

## 2022-08-22 ENCOUNTER — Other Ambulatory Visit: Payer: Self-pay | Admitting: Oncology

## 2022-08-22 DIAGNOSIS — C61 Malignant neoplasm of prostate: Secondary | ICD-10-CM

## 2022-09-05 ENCOUNTER — Telehealth: Payer: Self-pay | Admitting: Pharmacy Technician

## 2022-09-12 ENCOUNTER — Inpatient Hospital Stay: Payer: Medicare Other | Attending: Oncology

## 2022-09-12 ENCOUNTER — Inpatient Hospital Stay (HOSPITAL_BASED_OUTPATIENT_CLINIC_OR_DEPARTMENT_OTHER): Payer: Medicare Other | Admitting: Oncology

## 2022-09-12 ENCOUNTER — Other Ambulatory Visit: Payer: Self-pay

## 2022-09-12 VITALS — BP 137/71 | HR 73 | Temp 97.9°F | Resp 15 | Wt 172.0 lb

## 2022-09-12 DIAGNOSIS — C61 Malignant neoplasm of prostate: Secondary | ICD-10-CM | POA: Insufficient documentation

## 2022-09-12 DIAGNOSIS — Z791 Long term (current) use of non-steroidal anti-inflammatories (NSAID): Secondary | ICD-10-CM | POA: Diagnosis not present

## 2022-09-12 DIAGNOSIS — I10 Essential (primary) hypertension: Secondary | ICD-10-CM | POA: Diagnosis not present

## 2022-09-12 LAB — CMP (CANCER CENTER ONLY)
ALT: 9 U/L (ref 0–44)
AST: 13 U/L — ABNORMAL LOW (ref 15–41)
Albumin: 3.9 g/dL (ref 3.5–5.0)
Alkaline Phosphatase: 44 U/L (ref 38–126)
Anion gap: 6 (ref 5–15)
BUN: 17 mg/dL (ref 8–23)
CO2: 27 mmol/L (ref 22–32)
Calcium: 9.2 mg/dL (ref 8.9–10.3)
Chloride: 104 mmol/L (ref 98–111)
Creatinine: 0.8 mg/dL (ref 0.61–1.24)
GFR, Estimated: >60 mL/min (ref >60)
Glucose, Bld: 112 mg/dL — ABNORMAL HIGH (ref 70–99)
Potassium: 3.9 mmol/L (ref 3.5–5.1)
Sodium: 137 mmol/L (ref 135–145)
Total Bilirubin: 0.3 mg/dL (ref 0.3–1.2)
Total Protein: 6.2 g/dL — ABNORMAL LOW (ref 6.5–8.1)

## 2022-09-12 LAB — CBC WITH DIFFERENTIAL (CANCER CENTER ONLY)
Abs Immature Granulocytes: 0 10*3/uL (ref 0.00–0.07)
Basophils Absolute: 0 10*3/uL (ref 0.0–0.1)
Basophils Relative: 1 %
Eosinophils Absolute: 0.2 10*3/uL (ref 0.0–0.5)
Eosinophils Relative: 3 %
HCT: 35.6 % — ABNORMAL LOW (ref 39.0–52.0)
Hemoglobin: 12.6 g/dL — ABNORMAL LOW (ref 13.0–17.0)
Immature Granulocytes: 0 %
Lymphocytes Relative: 39 %
Lymphs Abs: 2 10*3/uL (ref 0.7–4.0)
MCH: 34.6 pg — ABNORMAL HIGH (ref 26.0–34.0)
MCHC: 35.4 g/dL (ref 30.0–36.0)
MCV: 97.8 fL (ref 80.0–100.0)
Monocytes Absolute: 0.4 10*3/uL (ref 0.1–1.0)
Monocytes Relative: 9 %
Neutro Abs: 2.5 10*3/uL (ref 1.7–7.7)
Neutrophils Relative %: 48 %
Platelet Count: 224 10*3/uL (ref 150–400)
RBC: 3.64 MIL/uL — ABNORMAL LOW (ref 4.22–5.81)
RDW: 12.3 % (ref 11.5–15.5)
WBC Count: 5.2 10*3/uL (ref 4.0–10.5)
nRBC: 0 % (ref 0.0–0.2)

## 2022-09-12 NOTE — Progress Notes (Signed)
Hematology and Oncology Follow Up Visit  Phillip Brown [id number] Nov 23, 1950 71 y.o. 09/12/2022 3:16 PM Pcp, NoNo ref. provider found   Principle Diagnosis: 71 year old man with advanced prostate cancer diagnosed in 2019.  He has castration-sensitive disease after presenting with lymphadenopathy, PSA 118 and Gleason score 4+5 = 9 at the time of diagnosis.  Prior Therapy:  Prostate biopsy in February 2019 which showed Gleason score 4+5 = 9 and 2 cores with 6 other cores of Gleason score 8.     Current therapy:  Eligard every 6 months under the care of Dr. Gloriann Loan.  Xtandi 160 mg daily started October 2019.   Interim History: Phillip Brown returns today for a repeat evaluation.  Since last visit, he reports no major changes in his health.  He denies any nausea, vomiting or fatigue.  He denies any excessive tiredness or complications related to [facility].  He denies any worsening edema.  His performance status and quality of life remains unchanged.  He denies any bone pain or pathological fractures.         Medications: Updated on review. Current Outpatient Medications  Medication Sig Dispense Refill   acetaminophen (TYLENOL) 500 MG tablet Take 500-1,000 mg by mouth every 6 (six) hours as needed (for pain or headaches).     BLACK PEPPER-TURMERIC PO Take 1 tablet by mouth daily.     Leuprolide Acetate (LUPRON IJ) Inject 1 Dose as directed every 6 (six) months.     Multiple Vitamin (MULTIVITAMIN WITH MINERALS) TABS tablet Take 2 tablets by mouth daily.     Multiple Vitamin (MULTIVITAMIN) tablet Take 2 tablets by mouth 2 (two) times daily. "bone up" calcium enriched multivitamin     Multiple Vitamins-Minerals (PRESERVISION AREDS) CAPS Take 1 capsule by mouth daily.     naproxen (NAPROSYN) 500 MG tablet Take 1 tablet (500 mg total) by mouth 2 (two) times daily with a meal. (Patient taking differently: Take 500 mg by mouth 2 (two) times daily as needed. ) 40 tablet 1   OVER THE COUNTER  MEDICATION Take 1 tablet by mouth daily as needed (energy). "blood builder" iron complex     oxyCODONE (OXY IR/ROXICODONE) 5 MG immediate release tablet Take 1-2 tablets (5-10 mg total) by mouth every 6 (six) hours as needed for moderate pain, severe pain or breakthrough pain. (Patient not taking: Reported on 12/30/2019) 30 tablet 0   tamsulosin (FLOMAX) 0.4 MG CAPS capsule Take 1 capsule (0.4 mg total) by mouth daily. 30 capsule 0   Tryptophan 500 MG TABS Take 500 mg by mouth at bedtime as needed (sleep).     XTANDI 40 MG tablet Take 4 tablets ('160mg'$ ) by mouth once daily as directed by physician. 120 tablet 1   No current facility-administered medications for this visit.     Allergies: No Known Allergies      Physical Exam:          Blood pressure 137/71, pulse 73, temperature 97.9 F (36.6 C), temperature source Temporal, resp. rate 15, weight 172 lb (78 kg), SpO2 98 %.     ECOG: 0    General appearance: Alert, awake without any distress. Head: Atraumatic without abnormalities Oropharynx: Without any thrush or ulcers. Eyes: No scleral icterus. Lymph nodes: No lymphadenopathy noted in the cervical, supraclavicular, or axillary nodes Heart:regular rate and rhythm, without any murmurs or gallops.   Lung: Clear to auscultation without any rhonchi, wheezes or dullness to percussion. Abdomin: Soft, nontender without any shifting dullness or ascites. Musculoskeletal: No  clubbing or cyanosis. Neurological: No motor or sensory deficits. Skin: No rashes or lesions.                  Lab Results: Lab Results  Component Value Date   WBC 4.8 05/16/2022   HGB 13.1 05/16/2022   HCT 37.5 (L) 05/16/2022   MCV 98.2 05/16/2022   PLT 178 05/16/2022     Chemistry      Component Value Date/Time   NA 138 05/16/2022 0922   K 4.0 05/16/2022 0922   CL 106 05/16/2022 0922   CO2 26 05/16/2022 0922   BUN 16 05/16/2022 0922   CREATININE 0.79 05/16/2022 0922       Component Value Date/Time   CALCIUM 9.2 05/16/2022 0922   ALKPHOS 48 05/16/2022 0922   AST 15 05/16/2022 0922   ALT 10 05/16/2022 0922   BILITOT 0.3 05/16/2022 0922            Latest Reference Range & Units 09/14/21 14:21 01/11/22 12:30 05/16/22 09:22  Prostate Specific Ag, Serum 0.0 - 4.0 ng/mL <0.1 <0.1 <0.1     Impression and Plan:  71 year old man with:   1.  Advanced prostate cancer with lymphadenopathy diagnosed in 2019.  He has castration-sensitive disease.  Treatment options were reviewed at this time which will be deferred unless he has progression of disease on Xtandi.  His PSA continues to be undetectable and has tolerated this treatment well.  Chemotherapy, PARP inhibitor as well as other oral targeted therapy could be considered if he has progression of disease in the future.   Guardant360 analysis to check for actionable mutation will be done if his PSA starts to rise or specifically PARP inhibitor use.  At this time, he is agreeable to continue with [facility] given his excellent tolerance and undetectable PSA.     2.  Androgen deprivation: He is currently on Eligard which will be continued indefinitely under the care of [facility].   3.  Prognosis and goals of care: Aggressive measures are warranted at this time.  His performance status remain excellent and his PSA is undetectable although his disease is incurable.  4.  Hypertension: This will continue to be monitored on Xtandi.  His blood pressure is within normal range.  5  Follow-up: In 4 months for repeat laboratory testing and clinical evaluation.   30  minutes were spent on this visit.  The time was dedicated to reviewing laboratory data, disease status update and outlining future plan of care discussion.  Zola Button, MD 11/9/20233:16 PM

## 2022-09-13 ENCOUNTER — Telehealth: Payer: Self-pay | Admitting: *Deleted

## 2022-09-13 LAB — PROSTATE-SPECIFIC AG, SERUM (LABCORP): Prostate Specific Ag, Serum: <0.1 ng/mL (ref 0.0–4.0)

## 2022-09-13 NOTE — Telephone Encounter (Signed)
Notified of message below

## 2022-09-13 NOTE — Telephone Encounter (Signed)
-----   Message from Phillip Portela, MD sent at 09/13/2022  8:47 AM EST ----- Please let him know his PSA is still down

## 2022-10-01 DIAGNOSIS — C61 Malignant neoplasm of prostate: Secondary | ICD-10-CM | POA: Diagnosis not present

## 2022-10-08 DIAGNOSIS — C61 Malignant neoplasm of prostate: Secondary | ICD-10-CM | POA: Diagnosis not present

## 2022-10-08 DIAGNOSIS — Z5111 Encounter for antineoplastic chemotherapy: Secondary | ICD-10-CM | POA: Diagnosis not present

## 2022-10-10 ENCOUNTER — Other Ambulatory Visit: Payer: Self-pay | Admitting: Oncology

## 2022-10-10 DIAGNOSIS — C61 Malignant neoplasm of prostate: Secondary | ICD-10-CM

## 2022-11-05 ENCOUNTER — Other Ambulatory Visit (HOSPITAL_COMMUNITY): Payer: Self-pay

## 2022-11-05 NOTE — Telephone Encounter (Signed)
Oral Oncology Patient Advocate Encounter   Received notification that patient is due for re-enrollment for assistance for Xtandi through [employer].   Re-enrollment process has been initiated.  Re-enrollment Key: [id number]  Current status is pending benefits investigation.  [employer] number [phone].   I will continue to follow until final determination.  Lady Deutscher, CPhT-Adv Oncology Pharmacy Patient [place] Direct Number: [phone]  Fax: [phone]

## 2022-11-11 NOTE — Telephone Encounter (Signed)
Oral Oncology Patient Advocate Encounter  Uploaded POI to [place] re-enrollment portal.  I will continue to check status until final determination.  Lady Deutscher, CPhT-Adv Oncology Pharmacy Patient [place] Direct Number: [phone]  Fax: [phone]

## 2022-11-11 NOTE — Telephone Encounter (Signed)
Oral Oncology Patient Advocate Encounter  Received update via fax regarding PAP application.  Application is pending proof of income.   Requested documentation will be submitted once received from patient.  Lady Deutscher, CPhT-Adv Oncology Pharmacy Patient [place] Direct Number: [phone]  Fax: [phone]

## 2022-11-15 ENCOUNTER — Other Ambulatory Visit: Payer: Self-pay | Admitting: *Deleted

## 2022-11-15 DIAGNOSIS — C61 Malignant neoplasm of prostate: Secondary | ICD-10-CM

## 2022-11-15 MED ORDER — ENZALUTAMIDE 40 MG PO TABS: ORAL_TABLET | ORAL | 1 refills | Status: DC

## 2022-11-18 ENCOUNTER — Telehealth: Payer: Self-pay | Admitting: Oncology

## 2022-11-18 NOTE — Telephone Encounter (Signed)
Called patient per Dr. Alen Blew transition. Patient r/s with new provider and notified.

## 2022-11-21 NOTE — Telephone Encounter (Signed)
Oral Oncology Patient Advocate Encounter   Received notification that the application for assistance for Xtandi through [employer] has been denied due to income limits exceeded.   [employer] number [phone].   I have spoken to the patient.  Alternative options will be discussed with the provider before further action is taken.  Lady Deutscher, CPhT-Adv Oncology Pharmacy Patient [place] Direct Number: [phone]  Fax: [phone]

## 2022-11-29 ENCOUNTER — Telehealth: Payer: Self-pay | Admitting: Pharmacy Technician

## 2022-11-29 ENCOUNTER — Telehealth: Payer: Self-pay | Admitting: Pharmacist

## 2022-11-29 ENCOUNTER — Other Ambulatory Visit (HOSPITAL_COMMUNITY): Payer: Self-pay

## 2022-11-29 DIAGNOSIS — C61 Malignant neoplasm of prostate: Secondary | ICD-10-CM

## 2022-11-29 MED ORDER — APALUTAMIDE 240 MG PO TABS: 240.0000 mg | ORAL_TABLET | Freq: Every day | ORAL | 2 refills | Status: DC

## 2022-11-29 NOTE — Telephone Encounter (Signed)
Oral Oncology Patient Advocate Encounter   Began application for assistance for Erleada through [employer].   Application submitted via online portal.  [employer] phone number [phone].   I will continue to check the status until final determination.   Lady Deutscher, CPhT-Adv Oncology Pharmacy Patient [place] Direct Number: [phone]  Fax: [phone]

## 2022-11-29 NOTE — Telephone Encounter (Signed)
Oral Oncology Pharmacist Encounter  Received new prescription for Erleada (apalutamide) for the treatment of metastatic castration sensitive prostate cancer in conjunction with ADT, planned duration until disease progression or unacceptable drug toxicity. OK per Dr. Irene Limbo on 11/29/22 for patient to switch from [place] to [place] due to patient no longer qualifying for assistance through [facility] Patient Assistance Program.    CBC w/ Diff and CMP from 09/12/22 assessed, no relevant lab abnormalities requiring baseline dose adjustment required at this time. PSA from 09/12/22 <0.1 ng/dL. Prescription dose and frequency assessed for appropriateness. Appropriate for therapy initiation.   Current medication list in Epic reviewed, DDIs with Erleada identified: Category C drug-drug interaction between Erleada and Oxycodone - Erleada, a strong CYP3A4 inducer may decrease serum concentrations of oxycodone, leading to decreased pain control. No changes in therapy warranted at this time.   Evaluated chart and no patient barriers to medication adherence noted.   Prescription will be e-scribed to [facility] Scripts as patient will be applying for manufacturer assistance to obtain Erleada at no cost.   Oral Oncology Clinic will continue to follow for copayment issues, initial counseling and start date.  Leron Croak, PharmD, BCPS, [facility] Hematology/Oncology Clinical Pharmacist [place] [place] [phone] 11/29/2022 3:06 PM

## 2022-12-02 NOTE — Telephone Encounter (Signed)
Oral Oncology Patient Advocate Encounter  Called to check status of application and was informed that the online HCP portion was not received. A physical copy of page 6 of the patient assistance application will be completed and sent in once signed.  Lady Deutscher, CPhT-Adv Oncology Pharmacy Patient [place] Direct Number: [phone]  Fax: [phone]

## 2022-12-04 NOTE — Telephone Encounter (Signed)
Oral Oncology Patient Advocate Encounter   Submitted page 6 of application as requested by program. I will continue to follow up as needed.  Lady Deutscher, CPhT-Adv Oncology Pharmacy Patient [place] Direct Number: [phone]  Fax: [phone]

## 2022-12-16 NOTE — Telephone Encounter (Signed)
Oral Oncology Patient Advocate Encounter  Called to check status of PAP application. Was notified that patient must apply through [employer] rather than [employer] due to his insurance status.   A new application is required, I have spoken to the patient.  Application will be submitted upon collection of all required signatures.  Lady Deutscher, CPhT-Adv Oncology Pharmacy Patient [place] Direct Number: [phone]  Fax: [phone]

## 2022-12-19 NOTE — Telephone Encounter (Signed)
Oral Oncology Patient Advocate Encounter   Submitted application for assistance for Erleada to [employer].   Application submitted via e-fax to [phone]   [employer] phone number [phone].   I will continue to check the status until final determination.   Lady Deutscher, CPhT-Adv Oncology Pharmacy Patient [place] Direct Number: [phone]  Fax: [phone]

## 2022-12-20 NOTE — Telephone Encounter (Signed)
Oral Oncology Patient Advocate Encounter   Received notification that the application for assistance for Phillip Brown through [employer] has been approved.   [employer] phone number [phone].   Effective dates: 12/20/22 through 12/20/23  I have spoken to the patient.  Lady Deutscher, CPhT-Adv Oncology Pharmacy Patient [place] Direct Number: [phone]  Fax: [phone]

## 2022-12-23 NOTE — Telephone Encounter (Signed)
Oral Chemotherapy Pharmacist Encounter  I spoke with patient for overview of: Erleada (apalutamide) for the treatment of metastatic castration sensitive prostate cancer in conjunction with ADT, planned duration until disease progression or unacceptable drug toxicity.   Counseled patient on administration, dosing, side effects, monitoring, drug-food interactions, safe handling, storage, and disposal.  Patient will take Erleada 262m tablets, 1 tablet (2431m by mouth once daily without regard to food.  Erleada start date: 12/24/22 (last dose of Xtandi 12/23/22)  Adverse effects include but are not limited to: rash, peripheral edema, GI upset, hypertension, hot flashes, fatigue, and arthralgias.    Reviewed with patient importance of keeping a medication schedule and plan for any missed doses. No barriers to medication adherence identified.  Medication reconciliation performed and medication/allergy list updated.  All questions answered.  Mr. MeBrouseoiced understanding and appreciation.   Medication education handout placed in mail for patient. Patient knows to call the office with questions or concerns. Oral Chemotherapy Clinic phone number provided to patient.   ReLeron CroakPharmD, BCPS, [facility] Cent[facility]atology/Oncology Clinical Pharmacist [facility] [phone]) [phone]/19/2024 11:42 AM

## 2023-01-07 ENCOUNTER — Other Ambulatory Visit: Payer: Self-pay

## 2023-01-07 DIAGNOSIS — C61 Malignant neoplasm of prostate: Secondary | ICD-10-CM

## 2023-01-08 ENCOUNTER — Inpatient Hospital Stay (HOSPITAL_BASED_OUTPATIENT_CLINIC_OR_DEPARTMENT_OTHER): Payer: Medicare Other | Admitting: Hematology

## 2023-01-08 ENCOUNTER — Ambulatory Visit: Payer: Medicare Other | Admitting: Oncology

## 2023-01-08 ENCOUNTER — Inpatient Hospital Stay: Payer: Medicare Other | Attending: Hematology

## 2023-01-08 ENCOUNTER — Other Ambulatory Visit: Payer: Medicare Other

## 2023-01-08 ENCOUNTER — Other Ambulatory Visit: Payer: Self-pay

## 2023-01-08 VITALS — BP 153/76 | HR 76 | Temp 97.9°F | Resp 18 | Wt 174.5 lb

## 2023-01-08 DIAGNOSIS — R338 Other retention of urine: Secondary | ICD-10-CM | POA: Diagnosis not present

## 2023-01-08 DIAGNOSIS — Z800 Family history of malignant neoplasm of digestive organs: Secondary | ICD-10-CM | POA: Insufficient documentation

## 2023-01-08 DIAGNOSIS — R600 Localized edema: Secondary | ICD-10-CM | POA: Insufficient documentation

## 2023-01-08 DIAGNOSIS — C61 Malignant neoplasm of prostate: Secondary | ICD-10-CM

## 2023-01-08 DIAGNOSIS — R5383 Other fatigue: Secondary | ICD-10-CM | POA: Insufficient documentation

## 2023-01-08 DIAGNOSIS — I10 Essential (primary) hypertension: Secondary | ICD-10-CM | POA: Diagnosis not present

## 2023-01-08 LAB — CBC WITH DIFFERENTIAL (CANCER CENTER ONLY)
Abs Immature Granulocytes: 0.01 10*3/uL (ref 0.00–0.07)
Basophils Absolute: 0 10*3/uL (ref 0.0–0.1)
Basophils Relative: 1 %
Eosinophils Absolute: 0.2 10*3/uL (ref 0.0–0.5)
Eosinophils Relative: 3 %
HCT: 37 % — ABNORMAL LOW (ref 39.0–52.0)
Hemoglobin: 13.1 g/dL (ref 13.0–17.0)
Immature Granulocytes: 0 %
Lymphocytes Relative: 32 %
Lymphs Abs: 1.6 10*3/uL (ref 0.7–4.0)
MCH: 34.2 pg — ABNORMAL HIGH (ref 26.0–34.0)
MCHC: 35.4 g/dL (ref 30.0–36.0)
MCV: 96.6 fL (ref 80.0–100.0)
Monocytes Absolute: 0.4 10*3/uL (ref 0.1–1.0)
Monocytes Relative: 8 %
Neutro Abs: 2.9 10*3/uL (ref 1.7–7.7)
Neutrophils Relative %: 56 %
Platelet Count: 241 10*3/uL (ref 150–400)
RBC: 3.83 MIL/uL — ABNORMAL LOW (ref 4.22–5.81)
RDW: 12.1 % (ref 11.5–15.5)
WBC Count: 5.1 10*3/uL (ref 4.0–10.5)
nRBC: 0 % (ref 0.0–0.2)

## 2023-01-08 LAB — CMP (CANCER CENTER ONLY)
ALT: 12 U/L (ref 0–44)
AST: 15 U/L (ref 15–41)
Albumin: 3.9 g/dL (ref 3.5–5.0)
Alkaline Phosphatase: 48 U/L (ref 38–126)
Anion gap: 8 (ref 5–15)
BUN: 13 mg/dL (ref 8–23)
CO2: 26 mmol/L (ref 22–32)
Calcium: 9.2 mg/dL (ref 8.9–10.3)
Chloride: 102 mmol/L (ref 98–111)
Creatinine: 0.79 mg/dL (ref 0.61–1.24)
GFR, Estimated: >60 mL/min (ref >60)
Glucose, Bld: 138 mg/dL — ABNORMAL HIGH (ref 70–99)
Potassium: 3.8 mmol/L (ref 3.5–5.1)
Sodium: 136 mmol/L (ref 135–145)
Total Bilirubin: 0.2 mg/dL — ABNORMAL LOW (ref 0.3–1.2)
Total Protein: 6.7 g/dL (ref 6.5–8.1)

## 2023-01-08 NOTE — Progress Notes (Signed)
HEMATOLOGY/ONCOLOGY CONSULTATION NOTE  Date of Service: 01/08/2023  Patient Care Team: Pcp, No as PCP - Huston Foley, MD as Consulting Physician (General Surgery) Lucas Mallow, MD as Consulting Physician (Urology)  CHIEF COMPLAINTS/PURPOSE OF CONSULTATION:  Evaluation and management of advanced metastatic prostate cancer.  HISTORY OF PRESENTING ILLNESS:  Phillip Brown is a wonderful 72 y.o. male who has been a previous patient of Dr. Alen Blew. He is here for evaluation and management of advanced prostate cancer.  He has castration-sensitive disease after presenting with lymphadenopathy, PSA 118 and Gleason score 4+5 = 9 at the time of diagnosis in 2019.   He was last seen by Dr. Alen Blew on 09/12/2022 and was doing well overall with no new medical concerns.  Today, he reports that he has been doing well overall and denies any new concerns.  He does complain of exertion-related fatigue. He has not had any previous issues tolerating Xtandi. He reports that he was not off of Xtandi  for an extended period before starting Apalutamide.  He confirms that he previously endorsed urinary retention issues in 2019 and has not received any local treatment for it. His urinary symptoms have since resolved.  He does take vitamin D supplements regularly, but is unsure of the dosage. He denies any back pain, abdominal pain, testicular pain, or testicular swelling. He does have some mild bilateral LE edema which is unchanged from baseline.  He does report a Fhx of cancer in his mother and uncle. His mother had colon cancer in her 98s and passed away at 58. He has no other Fhx of cancers. He denies any concern for skin cancers.   He denies any new bone pains, unexpected weight loss, or other new concerns. His other medications have been stable and he reports that he occasionally takes melatonin.  MEDICAL HISTORY:  Past Medical History:  Diagnosis Date   Bilateral inguinal hernia    BPH  (benign prostatic hyperplasia)    Prostate cancer ([facility])    being managed by Dr Link Snuffer with [facility]    Urinary catheter in place     SURGICAL HISTORY: Past Surgical History:  Procedure Laterality Date   APPENDECTOMY     age 52    [place]   Inguinal; Dr Burgess Amor    INGUINAL HERNIA REPAIR Bilateral 04/23/2018   Procedure: LAPAROSCOPIC BILATERAL INGUINAL HERNIA REPAIRS ERAS PATHWAY BILATERAL TAP BLOCK;  Surgeon: Michael Boston, MD;  Location: [facility];  Service: General;  Laterality: Bilateral;   INSERTION OF MESH Bilateral 04/23/2018   Procedure: INSERTION OF MESH;  Surgeon: Michael Boston, MD;  Location: [facility];  Service: General;  Laterality: Bilateral;   LAPAROSCOPIC LYSIS OF ADHESIONS  04/23/2018   Procedure: LAPAROSCOPIC LYSIS OF ADHESIONS;  Surgeon: Michael Boston, MD;  Location: [facility];  Service: General;;   NEPHRECTOMY  age 4   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 and later taken out same time as appendectomy     SOCIAL HISTORY: Social History   Socioeconomic History   Marital status: Married    Spouse name: Not on file   Number of children: Not on file   Years of education: Not on file   Highest education level: Not on file  Occupational History   Not on file  Tobacco Use   Smoking status: Never   Smokeless tobacco: Never  Substance and Sexual Activity   Alcohol use: Yes    Comment: occasionally    Drug use: No  Sexual activity: Not on file  Other Topics Concern   Not on file  Social History Narrative   Not on file   Social Determinants of Health   Financial Resource Strain: Not on file  Food Insecurity: Not on file  Transportation Needs: Not on file  Physical Activity: Not on file  Stress: Not on file  Social Connections: Not on file  Intimate Partner Violence: Not on file    FAMILY HISTORY: No family history on file.  ALLERGIES:  has No Known Allergies.  MEDICATIONS:  Current Outpatient Medications  Medication Sig Dispense  Refill   acetaminophen (TYLENOL) 500 MG tablet Take 500-1,000 mg by mouth every 6 (six) hours as needed (for pain or headaches).     apalutamide (ERLEADA) 240 MG tablet Take 1 tablet (240 mg total) by mouth daily. 30 tablet 2   BLACK PEPPER-TURMERIC PO Take 1 tablet by mouth daily.     Leuprolide Acetate (LUPRON IJ) Inject 1 Dose as directed every 6 (six) months.     Multiple Vitamin (MULTIVITAMIN WITH MINERALS) TABS tablet Take 2 tablets by mouth daily.     Multiple Vitamin (MULTIVITAMIN) tablet Take 2 tablets by mouth 2 (two) times daily. "bone up" calcium enriched multivitamin     Multiple Vitamins-Minerals (PRESERVISION AREDS) CAPS Take 1 capsule by mouth daily.     naproxen (NAPROSYN) 500 MG tablet Take 1 tablet (500 mg total) by mouth 2 (two) times daily with a meal. (Patient taking differently: Take 500 mg by mouth 2 (two) times daily as needed. ) 40 tablet 1   OVER THE COUNTER MEDICATION Take 1 tablet by mouth daily as needed (energy). "blood builder" iron complex     oxyCODONE (OXY IR/ROXICODONE) 5 MG immediate release tablet Take 1-2 tablets (5-10 mg total) by mouth every 6 (six) hours as needed for moderate pain, severe pain or breakthrough pain. (Patient not taking: Reported on 12/30/2019) 30 tablet 0   tamsulosin (FLOMAX) 0.4 MG CAPS capsule Take 1 capsule (0.4 mg total) by mouth daily. 30 capsule 0   Tryptophan 500 MG TABS Take 500 mg by mouth at bedtime as needed (sleep).     No current facility-administered medications for this visit.    REVIEW OF SYSTEMS:    10 Point review of Systems was done is negative except as noted above.  PHYSICAL EXAMINATION: ECOG PERFORMANCE STATUS: 1 - Symptomatic but completely ambulatory  . Vitals:   01/08/23 1405  BP: (!) 153/76  Pulse: 76  Resp: 18  Temp: 97.9 F (36.6 C)  SpO2: 100%   Filed Weights   01/08/23 1405  Weight: 174 lb 8 oz (79.2 kg)   .Body mass index is 25.04 kg/m.  GENERAL:alert, in no acute distress and  comfortable SKIN: no acute rashes, no significant lesions EYES: conjunctiva are pink and non-injected, sclera anicteric OROPHARYNX: MMM, no exudates, no oropharyngeal erythema or ulceration NECK: supple, no JVD LYMPH:  no palpable lymphadenopathy in the cervical, axillary or inguinal regions LUNGS: clear to auscultation b/l with normal respiratory effort HEART: regular rate & rhythm ABDOMEN:  normoactive bowel sounds , non tender, not distended. Extremity: no pedal edema PSYCH: alert & oriented x 3 with fluent speech NEURO: no focal motor/sensory deficits  LABORATORY DATA:  I have reviewed the data as listed .    Latest Ref Rng & Units 01/08/2023    1:52 PM 09/12/2022    3:15 PM 05/16/2022    9:22 AM  CBC  WBC 4.0 - 10.5 K/uL 5.1  5.2  4.8   Hemoglobin 13.0 - 17.0 g/dL 13.1  12.6  13.1   Hematocrit 39.0 - 52.0 % 37.0  35.6  37.5   Platelets 150 - 400 K/uL 241  224  178    .    Latest Ref Rng & Units 01/08/2023    1:52 PM 09/12/2022    3:15 PM 05/16/2022    9:22 AM  CMP  Glucose 70 - 99 mg/dL 138  112  103   BUN 8 - 23 mg/dL '13  17  16   '$ Creatinine 0.61 - 1.24 mg/dL 0.79  0.80  0.79   Sodium 135 - 145 mmol/L 136  137  138   Potassium 3.5 - 5.1 mmol/L 3.8  3.9  4.0   Chloride 98 - 111 mmol/L 102  104  106   CO2 22 - 32 mmol/L '26  27  26   '$ Calcium 8.9 - 10.3 mg/dL 9.2  9.2  9.2   Total Protein 6.5 - 8.1 g/dL 6.7  6.2  6.3   Total Bilirubin 0.3 - 1.2 mg/dL 0.2  0.3  0.3   Alkaline Phos 38 - 126 U/L 48  44  48   AST 15 - 41 U/L '15  13  15   '$ ALT 0 - 44 U/L '12  9  10    '$ PSA <0.1   RADIOGRAPHIC STUDIES: I have personally reviewed the radiological images as listed and agreed with the findings in the report. No results found.  ASSESSMENT & PLAN:   Wonderful 72 y/o male with:  Advanced prostate cancer with lymphadenopathy diagnosed in 2019.  He has castration-sensitive disease.   Androgen deprivation: He is currently on Eligard which will be continued indefinitely under the  care of [facility].  Prognosis and goals of care: Aggressive measures are warranted at this time.  His performance status remain excellent and his PSA is undetectable although his disease is incurable.  Hypertension:  This will continue to be monitored on Xtandi.  His blood pressure is within normal range.   PLAN: -patient has transferred care from Dr Alen Blew. Previous oncologic history confirmed with the oatient. -Discussed lab results on 01/08/2023 in detail with patient. CBC stable, showed WBC of 5.1K, hemoglobin of 13.1, and platelets of 241K. -last PSA undetectable and PSA today remains undetectable. -CMP stable -educated patient on cellular therapies, local radiation therapy, and immuno therapies -Patient receives Eligard injections every 6 months under the care of Dr. Gloriann Loan -Recommend patient to stay active and consume adequate levels calcium for bone strength. Recommend vitamin D levels in the high-normal range, recommend 4000-5000 units of Vitamin D daily. -Discussed option of bone density scan  -Discussed that the highest risk of progression of disease is generally in the first few years. -Recommend patient to engage in   anticancer approaches such as consuming plenty of fresh fruits and vegetables, stay active, and to de-stress as  -continue to monitor with labs every 4 labs -answered all of patient's questions  FOLLOW-UP: RTC with Dr Irene Limbo with labs in 4 months  The total time spent in the appointment was 30 minutes* .  All of the patient's questions were answered with apparent satisfaction. The patient knows to call the clinic with any problems, questions or concerns.   Sullivan Lone MD MS AAHIVMS [facility] Hematology/Oncology Physician [facility]  .*Total Encounter Time as defined by the Centers for Medicare and Medicaid Services includes, in addition to the face-to-face time of a patient  visit (documented in the note above) non-face-to-face time: obtaining and  reviewing outside history, ordering and reviewing medications, tests or procedures, care coordination (communications with other health care professionals or caregivers) and documentation in the medical record.   I,Mitra Faeizi,acting as a Education administrator for Sullivan Lone, MD.,have documented all relevant documentation on the behalf of Sullivan Lone, MD,as directed by  Sullivan Lone, MD while in the presence of Sullivan Lone, MD.  .I have reviewed the above documentation for accuracy and completeness, and I agree with the above. Brunetta Genera MD

## 2023-01-10 ENCOUNTER — Telehealth: Payer: Self-pay | Admitting: Hematology

## 2023-01-10 LAB — PROSTATE-SPECIFIC AG, SERUM (LABCORP): Prostate Specific Ag, Serum: <0.1 ng/mL (ref 0.0–4.0)

## 2023-01-10 NOTE — Telephone Encounter (Signed)
Called patient per 3/6 los notes to schedule f/u. Left voicemail with new appointment information and contact details if needing to reschedule.  

## 2023-04-04 DIAGNOSIS — C61 Malignant neoplasm of prostate: Secondary | ICD-10-CM | POA: Diagnosis not present

## 2023-04-11 DIAGNOSIS — C61 Malignant neoplasm of prostate: Secondary | ICD-10-CM | POA: Diagnosis not present

## 2023-04-11 DIAGNOSIS — C775 Secondary and unspecified malignant neoplasm of intrapelvic lymph nodes: Secondary | ICD-10-CM | POA: Diagnosis not present

## 2023-05-15 ENCOUNTER — Other Ambulatory Visit: Payer: Self-pay

## 2023-05-15 DIAGNOSIS — C61 Malignant neoplasm of prostate: Secondary | ICD-10-CM

## 2023-05-16 ENCOUNTER — Inpatient Hospital Stay (HOSPITAL_BASED_OUTPATIENT_CLINIC_OR_DEPARTMENT_OTHER): Payer: Medicare Other | Admitting: Hematology

## 2023-05-16 ENCOUNTER — Other Ambulatory Visit: Payer: Self-pay

## 2023-05-16 ENCOUNTER — Inpatient Hospital Stay: Payer: Medicare Other | Attending: Hematology

## 2023-05-16 DIAGNOSIS — C61 Malignant neoplasm of prostate: Secondary | ICD-10-CM | POA: Diagnosis not present

## 2023-05-16 DIAGNOSIS — R600 Localized edema: Secondary | ICD-10-CM | POA: Diagnosis not present

## 2023-05-16 DIAGNOSIS — I10 Essential (primary) hypertension: Secondary | ICD-10-CM | POA: Diagnosis not present

## 2023-05-16 DIAGNOSIS — Z800 Family history of malignant neoplasm of digestive organs: Secondary | ICD-10-CM | POA: Diagnosis not present

## 2023-05-16 LAB — CMP (CANCER CENTER ONLY)
ALT: 12 U/L (ref 0–44)
AST: 16 U/L (ref 15–41)
Albumin: 4.1 g/dL (ref 3.5–5.0)
Alkaline Phosphatase: 49 U/L (ref 38–126)
Anion gap: 8 (ref 5–15)
BUN: 15 mg/dL (ref 8–23)
CO2: 26 mmol/L (ref 22–32)
Calcium: 9.3 mg/dL (ref 8.9–10.3)
Chloride: 104 mmol/L (ref 98–111)
Creatinine: 0.86 mg/dL (ref 0.61–1.24)
GFR, Estimated: >60 mL/min (ref >60)
Glucose, Bld: 100 mg/dL — ABNORMAL HIGH (ref 70–99)
Potassium: 4.4 mmol/L (ref 3.5–5.1)
Sodium: 138 mmol/L (ref 135–145)
Total Bilirubin: 0.2 mg/dL — ABNORMAL LOW (ref 0.3–1.2)
Total Protein: 6.6 g/dL (ref 6.5–8.1)

## 2023-05-16 LAB — CBC WITH DIFFERENTIAL (CANCER CENTER ONLY)
Abs Immature Granulocytes: 0.01 10*3/uL (ref 0.00–0.07)
Basophils Absolute: 0 10*3/uL (ref 0.0–0.1)
Basophils Relative: 1 %
Eosinophils Absolute: 0.1 10*3/uL (ref 0.0–0.5)
Eosinophils Relative: 2 %
HCT: 39.4 % (ref 39.0–52.0)
Hemoglobin: 13.6 g/dL (ref 13.0–17.0)
Immature Granulocytes: 0 %
Lymphocytes Relative: 38 %
Lymphs Abs: 1.6 10*3/uL (ref 0.7–4.0)
MCH: 34.5 pg — ABNORMAL HIGH (ref 26.0–34.0)
MCHC: 34.5 g/dL (ref 30.0–36.0)
MCV: 100 fL (ref 80.0–100.0)
Monocytes Absolute: 0.4 10*3/uL (ref 0.1–1.0)
Monocytes Relative: 9 %
Neutro Abs: 2.1 10*3/uL (ref 1.7–7.7)
Neutrophils Relative %: 50 %
Platelet Count: 211 10*3/uL (ref 150–400)
RBC: 3.94 MIL/uL — ABNORMAL LOW (ref 4.22–5.81)
RDW: 12.6 % (ref 11.5–15.5)
WBC Count: 4.2 10*3/uL (ref 4.0–10.5)
nRBC: 0 % (ref 0.0–0.2)

## 2023-05-16 NOTE — Progress Notes (Signed)
HEMATOLOGY/ONCOLOGY CLINIC NOTE  Date of Service: 05/16/2023  Patient Care Team: Pcp, No as PCP - Erline Hau, MD as Consulting Physician (General Surgery) Crista Elliot, MD as Consulting Physician (Urology)  CHIEF COMPLAINTS/PURPOSE OF CONSULTATION:  Evaluation and management of advanced metastatic prostate cancer.  HISTORY OF PRESENTING ILLNESS:  Phillip Brown is a wonderful 72 y.o. male who has been a previous patient of Dr. Clelia Croft. He is here for evaluation and management of advanced prostate cancer.  He has castration-sensitive disease after presenting with lymphadenopathy, PSA 118 and Gleason score 4+5 = 9 at the time of diagnosis in 2019.   He was last seen by Dr. Clelia Croft on 09/12/2022 and was doing well overall with no new medical concerns.  Today, he reports that he has been doing well overall and denies any new concerns.  He does complain of exertion-related fatigue. He has not had any previous issues tolerating Xtandi. He reports that he was not off of Xtandi  for an extended period before starting Apalutamide.  He confirms that he previously endorsed urinary retention issues in 2019 and has not received any local treatment for it. His urinary symptoms have since resolved.  He does take vitamin D supplements regularly, but is unsure of the dosage. He denies any back pain, abdominal pain, testicular pain, or testicular swelling. He does have some mild bilateral LE edema which is unchanged from baseline.  He does report a Fhx of cancer in his mother and uncle. His mother had colon cancer in her 58s and passed away at 78. He has no other Fhx of cancers. He denies any concern for skin cancers.   He denies any new bone pains, unexpected weight loss, or other new concerns. His other medications have been stable and he reports that he occasionally takes melatonin.  INTERVAL HISTORY:  Phillip Brown is a 72 y.o. male here for continued evaluation and management  of advanced metastatic prostate cancer.   Patient was initially seen by me on 01/08/2023 and complained of exertion-related fatigue and mild bilateral LE edema.  Today, he reports that he has felt well overall since his last visit. He does report heat-related fatigue, but his energy levels have otherwise been decent. He denies any localized bone pain, headaches, SOB, chest pain, abdominal pain/distention, changes in urination or bowel habits, infection issues, or back pain. His leg swelling has been stable and his hot flashes have been manageable.   He takes two tablets of vitamin D regularly as well as a bone strengthening supplement. He stays active by walking 3/4 a mile regularly.   Patient continues to receive Eligard every 6 months and he most recently received a dose a few weeks ago. Patient has been tolerating Apalutamide well with no toxicity issues and has no issues with receiving Erleada on time.   MEDICAL HISTORY:  Past Medical History:  Diagnosis Date   Bilateral inguinal hernia    BPH (benign prostatic hyperplasia)    Prostate cancer ([facility])    being managed by Dr Modena [name] with [facility]    Urinary catheter in place     SURGICAL HISTORY: Past Surgical History:  Procedure Laterality Date   APPENDECTOMY     age 29    HERNIA REPAIR  1980   Inguinal; Dr Patricia Nettle    INGUINAL HERNIA REPAIR Bilateral 04/23/2018   Procedure: LAPAROSCOPIC BILATERAL INGUINAL HERNIA REPAIRS ERAS PATHWAY BILATERAL TAP BLOCK;  Surgeon: Karie Soda, MD;  Location: [facility];  Service: General;  Laterality: Bilateral;   INSERTION OF MESH Bilateral 04/23/2018   Procedure: INSERTION OF MESH;  Surgeon: Karie Soda, MD;  Location: [facility];  Service: General;  Laterality: Bilateral;   LAPAROSCOPIC LYSIS OF ADHESIONS  04/23/2018   Procedure: LAPAROSCOPIC LYSIS OF ADHESIONS;  Surgeon: Karie Soda, MD;  Location: [facility];  Service: General;;   NEPHRECTOMY  age 15   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 and  later taken out same time as appendectomy     SOCIAL HISTORY: Social History   Socioeconomic History   Marital status: Married    Spouse name: Not on file   Number of children: Not on file   Years of education: Not on file   Highest education level: Not on file  Occupational History   Not on file  Tobacco Use   Smoking status: Never   Smokeless tobacco: Never  Substance and Sexual Activity   Alcohol use: Yes    Comment: occasionally    Drug use: No   Sexual activity: Not on file  Other Topics Concern   Not on file  Social History Narrative   Not on file   Social Determinants of Health   Financial Resource Strain: Not on file  Food Insecurity: Not on file  Transportation Needs: Not on file  Physical Activity: Not on file  Stress: Not on file  Social Connections: Not on file  Intimate Partner Violence: Not on file    FAMILY HISTORY: No family history on file.  ALLERGIES:  has No Known Allergies.  MEDICATIONS:  Current Outpatient Medications  Medication Sig Dispense Refill   acetaminophen (TYLENOL) 500 MG tablet Take 500-1,000 mg by mouth every 6 (six) hours as needed (for pain or headaches).     apalutamide (ERLEADA) 240 MG tablet Take 1 tablet (240 mg total) by mouth daily. 30 tablet 2   BLACK PEPPER-TURMERIC PO Take 1 tablet by mouth daily.     Leuprolide Acetate (LUPRON IJ) Inject 1 Dose as directed every 6 (six) months.     Multiple Vitamin (MULTIVITAMIN WITH MINERALS) TABS tablet Take 2 tablets by mouth daily.     Multiple Vitamin (MULTIVITAMIN) tablet Take 2 tablets by mouth 2 (two) times daily. "bone up" calcium enriched multivitamin     Multiple Vitamins-Minerals (PRESERVISION AREDS) CAPS Take 1 capsule by mouth daily.     naproxen (NAPROSYN) 500 MG tablet Take 1 tablet (500 mg total) by mouth 2 (two) times daily with a meal. (Patient taking differently: Take 500 mg by mouth 2 (two) times daily as needed. ) 40 tablet 1   OVER THE COUNTER MEDICATION Take 1  tablet by mouth daily as needed (energy). "blood builder" iron complex     oxyCODONE (OXY IR/ROXICODONE) 5 MG immediate release tablet Take 1-2 tablets (5-10 mg total) by mouth every 6 (six) hours as needed for moderate pain, severe pain or breakthrough pain. (Patient not taking: Reported on 12/30/2019) 30 tablet 0   tamsulosin (FLOMAX) 0.4 MG CAPS capsule Take 1 capsule (0.4 mg total) by mouth daily. 30 capsule 0   Tryptophan 500 MG TABS Take 500 mg by mouth at bedtime as needed (sleep).     No current facility-administered medications for this visit.    REVIEW OF SYSTEMS:    10 Point review of Systems was done is negative except as noted above.   PHYSICAL EXAMINATION: ECOG PERFORMANCE STATUS: 1 - Symptomatic but completely ambulatory  GENERAL:alert, in no acute distress and comfortable SKIN: no acute  rashes, no significant lesions EYES: conjunctiva are pink and non-injected, sclera anicteric OROPHARYNX: MMM, no exudates, no oropharyngeal erythema or ulceration NECK: supple, no JVD LYMPH:  no palpable lymphadenopathy in the cervical, axillary or inguinal regions LUNGS: clear to auscultation b/l with normal respiratory effort HEART: regular rate & rhythm ABDOMEN:  normoactive bowel sounds , non tender, not distended. Extremity: no pedal edema PSYCH: alert & oriented x 3 with fluent speech NEURO: no focal motor/sensory deficits   LABORATORY DATA:  I have reviewed the data as listed .    Latest Ref Rng & Units 05/16/2023    1:25 PM 01/08/2023    1:52 PM 09/12/2022    3:15 PM  CBC  WBC 4.0 - 10.5 K/uL 4.2  5.1  5.2   Hemoglobin 13.0 - 17.0 g/dL 16.1  09.6  04.5   Hematocrit 39.0 - 52.0 % 39.4  37.0  35.6   Platelets 150 - 400 K/uL 211  241  224    .    Latest Ref Rng & Units 05/16/2023    1:25 PM 01/08/2023    1:52 PM 09/12/2022    3:15 PM  CMP  Glucose 70 - 99 mg/dL 409  811  914   BUN 8 - 23 mg/dL 15  13  17    Creatinine 0.61 - 1.24 mg/dL 7.82  9.56  2.13   Sodium 135 - 145  mmol/L 138  136  137   Potassium 3.5 - 5.1 mmol/L 4.4  3.8  3.9   Chloride 98 - 111 mmol/L 104  102  104   CO2 22 - 32 mmol/L 26  26  27    Calcium 8.9 - 10.3 mg/dL 9.3  9.2  9.2   Total Protein 6.5 - 8.1 g/dL 6.6  6.7  6.2   Total Bilirubin 0.3 - 1.2 mg/dL 0.2  0.2  0.3   Alkaline Phos 38 - 126 U/L 49  48  44   AST 15 - 41 U/L 16  15  13    ALT 0 - 44 U/L 12  12  9     PSA <0.1   RADIOGRAPHIC STUDIES: I have personally reviewed the radiological images as listed and agreed with the findings in the report. No results found.  ASSESSMENT & PLAN:   Wonderful 71 y/o male with:  Advanced prostate cancer with lymphadenopathy diagnosed in 2019.  He has castration-sensitive disease.   Androgen deprivation: He is currently on Eligard which will be continued indefinitely under the care of [facility].  Prognosis and goals of care: Aggressive measures are warranted at this time.  His performance status remain excellent and his PSA is undetectable although his disease is incurable.  Hypertension:  This will continue to be monitored on Xtandi.  His blood pressure is within normal range.   PLAN:  -Discussed lab results on 05/16/2023 in detail with patient. CBC showed WBC of 4.2K, hemoglobin improved 13.6, and platelets of 211K. -CMP stable, normal kidney function, normal liver enzymes -Today's PSA pending, previous PSA lab negative -continue Erleada for prostate cancer management -Patient receives Eligard injections every 6 months under the care of Dr. Alvester Morin -continue to engage in anticancer approaches such as consuming plenty of fresh fruits and vegetables, staying active, and de-stressing -recommend patient to take 4000-5000 units of vitamin D daily  -will continue to monitor with labs in 4 months  FOLLOW-UP: RTC with Dr Candise Che with labs in 4 months  The total time spent in the appointment was 21 minutes* .  All of the patient's questions were answered with apparent satisfaction. The  patient knows to call the clinic with any problems, questions or concerns.   Wyvonnia Lora MD MS AAHIVMS [facility] Hematology/Oncology Physician [facility]  .*Total Encounter Time as defined by the Centers for Medicare and Medicaid Services includes, in addition to the face-to-face time of a patient visit (documented in the note above) non-face-to-face time: obtaining and reviewing outside history, ordering and reviewing medications, tests or procedures, care coordination (communications with other health care professionals or caregivers) and documentation in the medical record.    I,Mitra Faeizi,acting as a Neurosurgeon for Wyvonnia Lora, MD.,have documented all relevant documentation on the behalf of Wyvonnia Lora, MD,as directed by  Wyvonnia Lora, MD while in the presence of Wyvonnia Lora, MD.  .I have reviewed the above documentation for accuracy and completeness, and I agree with the above. Johney Maine MD

## 2023-05-18 LAB — PROSTATE-SPECIFIC AG, SERUM (LABCORP): Prostate Specific Ag, Serum: <0.1 ng/mL (ref 0.0–4.0)

## 2023-05-19 ENCOUNTER — Telehealth: Payer: Self-pay | Admitting: Hematology

## 2023-05-19 NOTE — Telephone Encounter (Signed)
Left patient a message of upcoming appointment times/dates

## 2023-06-19 ENCOUNTER — Other Ambulatory Visit: Payer: Self-pay

## 2023-06-19 ENCOUNTER — Other Ambulatory Visit: Payer: Self-pay | Admitting: Hematology

## 2023-06-19 DIAGNOSIS — C61 Malignant neoplasm of prostate: Secondary | ICD-10-CM

## 2023-06-19 MED ORDER — APALUTAMIDE 240 MG PO TABS
240.0000 mg | ORAL_TABLET | Freq: Every day | ORAL | 2 refills | Status: DC
Start: 2023-06-19 — End: 2023-06-23

## 2023-06-23 ENCOUNTER — Telehealth: Payer: Self-pay | Admitting: Pharmacist

## 2023-06-23 DIAGNOSIS — C61 Malignant neoplasm of prostate: Secondary | ICD-10-CM

## 2023-06-23 MED ORDER — APALUTAMIDE 240 MG PO TABS
240.0000 mg | ORAL_TABLET | Freq: Every day | ORAL | 2 refills | Status: DC
Start: 2023-06-23 — End: 2023-09-23

## 2023-06-23 NOTE — Telephone Encounter (Signed)
Oral Oncology Pharmacist Encounter   Since patient is enrolled in [employer], he is required to continue filling through [employer]. Erleada prescription has been redirected for dispensing.    Lenord Carbo, PharmD, BCPS, BCOP Hematology/Oncology Clinical Pharmacist [facility] Oral Chemotherapy Navigation Clinics [phone] 06/23/2023 1:45 PM

## 2023-08-28 ENCOUNTER — Telehealth: Payer: Self-pay | Admitting: Pharmacy Technician

## 2023-08-28 NOTE — Telephone Encounter (Signed)
Oral Oncology Patient Advocate Encounter   Received notification that patient is due for re-enrollment for assistance for Erleada through [employer].   Re-enrollment process has been initiated and will be submitted upon completion of necessary documents.  Patient to sign at next appointment on 09/16/23  [employer] phone number [phone].   I will continue to follow until final determination.  Jinger Neighbors, CPhT-Adv Oncology Pharmacy Patient Advocate [facility] Direct Number: [phone]  Fax: [phone]

## 2023-09-15 ENCOUNTER — Other Ambulatory Visit: Payer: Self-pay

## 2023-09-15 DIAGNOSIS — C61 Malignant neoplasm of prostate: Secondary | ICD-10-CM

## 2023-09-16 ENCOUNTER — Inpatient Hospital Stay: Payer: Medicare Other | Attending: Hematology

## 2023-09-16 ENCOUNTER — Inpatient Hospital Stay (HOSPITAL_BASED_OUTPATIENT_CLINIC_OR_DEPARTMENT_OTHER): Payer: Medicare Other | Admitting: Hematology

## 2023-09-16 VITALS — BP 151/71 | HR 75 | Temp 97.3°F | Resp 18 | Wt 175.2 lb

## 2023-09-16 DIAGNOSIS — Z800 Family history of malignant neoplasm of digestive organs: Secondary | ICD-10-CM | POA: Insufficient documentation

## 2023-09-16 DIAGNOSIS — C61 Malignant neoplasm of prostate: Secondary | ICD-10-CM | POA: Diagnosis not present

## 2023-09-16 DIAGNOSIS — R600 Localized edema: Secondary | ICD-10-CM | POA: Insufficient documentation

## 2023-09-16 DIAGNOSIS — I10 Essential (primary) hypertension: Secondary | ICD-10-CM | POA: Insufficient documentation

## 2023-09-16 LAB — CBC WITH DIFFERENTIAL (CANCER CENTER ONLY)
Abs Immature Granulocytes: 0.01 10*3/uL (ref 0.00–0.07)
Basophils Absolute: 0 10*3/uL (ref 0.0–0.1)
Basophils Relative: 1 %
Eosinophils Absolute: 0.1 10*3/uL (ref 0.0–0.5)
Eosinophils Relative: 2 %
HCT: 37.9 % — ABNORMAL LOW (ref 39.0–52.0)
Hemoglobin: 13.3 g/dL (ref 13.0–17.0)
Immature Granulocytes: 0 %
Lymphocytes Relative: 26 %
Lymphs Abs: 1.5 10*3/uL (ref 0.7–4.0)
MCH: 34.1 pg — ABNORMAL HIGH (ref 26.0–34.0)
MCHC: 35.1 g/dL (ref 30.0–36.0)
MCV: 97.2 fL (ref 80.0–100.0)
Monocytes Absolute: 0.5 10*3/uL (ref 0.1–1.0)
Monocytes Relative: 8 %
Neutro Abs: 3.6 10*3/uL (ref 1.7–7.7)
Neutrophils Relative %: 63 %
Platelet Count: 241 10*3/uL (ref 150–400)
RBC: 3.9 MIL/uL — ABNORMAL LOW (ref 4.22–5.81)
RDW: 12.6 % (ref 11.5–15.5)
WBC Count: 5.7 10*3/uL (ref 4.0–10.5)
nRBC: 0 % (ref 0.0–0.2)

## 2023-09-16 LAB — CMP (CANCER CENTER ONLY)
ALT: 16 U/L (ref 0–44)
AST: 21 U/L (ref 15–41)
Albumin: 4 g/dL (ref 3.5–5.0)
Alkaline Phosphatase: 49 U/L (ref 38–126)
Anion gap: 7 (ref 5–15)
BUN: 14 mg/dL (ref 8–23)
CO2: 26 mmol/L (ref 22–32)
Calcium: 9.2 mg/dL (ref 8.9–10.3)
Chloride: 104 mmol/L (ref 98–111)
Creatinine: 0.83 mg/dL (ref 0.61–1.24)
GFR, Estimated: >60 mL/min (ref >60)
Glucose, Bld: 101 mg/dL — ABNORMAL HIGH (ref 70–99)
Potassium: 4.1 mmol/L (ref 3.5–5.1)
Sodium: 137 mmol/L (ref 135–145)
Total Bilirubin: 0.3 mg/dL (ref <1.2)
Total Protein: 6.6 g/dL (ref 6.5–8.1)

## 2023-09-16 NOTE — Telephone Encounter (Signed)
Oral Oncology Patient Advocate Encounter   Submitted application for assistance for Erleada to [employer].   Application submitted via e-fax to [phone]   [employer] phone number [phone].   I will continue to check the status until final determination.   Jinger Neighbors, CPhT-Adv Oncology Pharmacy Patient Advocate [facility] Direct Number: [phone]  Fax: [phone]

## 2023-09-16 NOTE — Progress Notes (Signed)
HEMATOLOGY/ONCOLOGY CLINIC NOTE  Date of Service: 09/16/2023  Patient Care Team: Pcp, No as PCP - Erline Hau, MD as Consulting Physician (General Surgery) Crista Elliot, MD as Consulting Physician (Urology)  CHIEF COMPLAINTS/PURPOSE OF CONSULTATION:  Evaluation and management of advanced metastatic prostate cancer.  HISTORY OF PRESENTING ILLNESS:  ALEXANDERJAMES IDA is a wonderful 72 y.o. male who has been a previous patient of Dr. Clelia Croft. He is here for evaluation and management of advanced prostate cancer.  He has castration-sensitive disease after presenting with lymphadenopathy, PSA 118 and Gleason score 4+5 = 9 at the time of diagnosis in 2019.   He was last seen by Dr. Clelia Croft on 09/12/2022 and was doing well overall with no new medical concerns.  Today, he reports that he has been doing well overall and denies any new concerns.  He does complain of exertion-related fatigue. He has not had any previous issues tolerating Xtandi. He reports that he was not off of Xtandi  for an extended period before starting Apalutamide.  He confirms that he previously endorsed urinary retention issues in 2019 and has not received any local treatment for it. His urinary symptoms have since resolved.  He does take vitamin D supplements regularly, but is unsure of the dosage. He denies any back pain, abdominal pain, testicular pain, or testicular swelling. He does have some mild bilateral LE edema which is unchanged from baseline.  He does report a Fhx of cancer in his mother and uncle. His mother had colon cancer in her 82s and passed away at 36. He has no other Fhx of cancers. He denies any concern for skin cancers.   He denies any new bone pains, unexpected weight loss, or other new concerns. His other medications have been stable and he reports that he occasionally takes melatonin.  INTERVAL HISTORY:  KIA GUSH is a 72 y.o. male here for continued evaluation and management  of advanced metastatic prostate cancer.   Patient was last seen by me on 05/15/2023 and he complained of mild fatigue and mild bilateral leg swelling.   Patient notes he has been doing well overall without any new or severe medical concerns since our last visit. He has been taking Erleada regularly as prescribed and he has been tolerating it well without any new or severe toxicities. He continues to receive Eligard every 6 months and has been tolerating it well.   He denies any new infection issues, fever, chills, night sweats, unexpected weight loss, bone pain, testicular swelling, chest pain, back pain, SOB, abnormal bowel movement, or leg swelling. He does endorse mild occasional hot flashes.   MEDICAL HISTORY:  Past Medical History:  Diagnosis Date   Bilateral inguinal hernia    BPH (benign prostatic hyperplasia)    Prostate cancer (HCC)    being managed by Dr Modena [name] with [facility]    Urinary catheter in place     SURGICAL HISTORY: Past Surgical History:  Procedure Laterality Date   APPENDECTOMY     age 16    HERNIA REPAIR  1980   Inguinal; Dr Patricia Nettle    INGUINAL HERNIA REPAIR Bilateral 04/23/2018   Procedure: LAPAROSCOPIC BILATERAL INGUINAL HERNIA REPAIRS ERAS PATHWAY BILATERAL TAP BLOCK;  Surgeon: Karie Soda, MD;  Location: [facility];  Service: General;  Laterality: Bilateral;   INSERTION OF MESH Bilateral 04/23/2018   Procedure: INSERTION OF MESH;  Surgeon: Karie Soda, MD;  Location: [facility];  Service: General;  Laterality: Bilateral;  LAPAROSCOPIC LYSIS OF ADHESIONS  04/23/2018   Procedure: LAPAROSCOPIC LYSIS OF ADHESIONS;  Surgeon: Karie Soda, MD;  Location: [facility];  Service: General;;   NEPHRECTOMY  age 58   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 and later taken out same time as appendectomy     SOCIAL HISTORY: Social History   Socioeconomic History   Marital status: Married    Spouse name: Not on file   Number of children: Not on file   Years of  education: Not on file   Highest education level: Not on file  Occupational History   Not on file  Tobacco Use   Smoking status: Never   Smokeless tobacco: Never  Substance and Sexual Activity   Alcohol use: Yes    Comment: occasionally    Drug use: No   Sexual activity: Not on file  Other Topics Concern   Not on file  Social History Narrative   Not on file   Social Determinants of Health   Financial Resource Strain: Not on file  Food Insecurity: Not on file  Transportation Needs: Not on file  Physical Activity: Not on file  Stress: Not on file  Social Connections: Not on file  Intimate Partner Violence: Not on file    FAMILY HISTORY: No family history on file.  ALLERGIES:  has No Known Allergies.  MEDICATIONS:  Current Outpatient Medications  Medication Sig Dispense Refill   acetaminophen (TYLENOL) 500 MG tablet Take 500-1,000 mg by mouth every 6 (six) hours as needed (for pain or headaches).     apalutamide (ERLEADA) 240 MG tablet Take 1 tablet (240 mg total) by mouth daily. 30 tablet 2   BLACK PEPPER-TURMERIC PO Take 1 tablet by mouth daily.     Leuprolide Acetate (LUPRON IJ) Inject 1 Dose as directed every 6 (six) months.     Multiple Vitamin (MULTIVITAMIN WITH MINERALS) TABS tablet Take 2 tablets by mouth daily.     Multiple Vitamin (MULTIVITAMIN) tablet Take 2 tablets by mouth 2 (two) times daily. "bone up" calcium enriched multivitamin     Multiple Vitamins-Minerals (PRESERVISION AREDS) CAPS Take 1 capsule by mouth daily.     naproxen (NAPROSYN) 500 MG tablet Take 1 tablet (500 mg total) by mouth 2 (two) times daily with a meal. (Patient taking differently: Take 500 mg by mouth 2 (two) times daily as needed. ) 40 tablet 1   OVER THE COUNTER MEDICATION Take 1 tablet by mouth daily as needed (energy). "blood builder" iron complex     oxyCODONE (OXY IR/ROXICODONE) 5 MG immediate release tablet Take 1-2 tablets (5-10 mg total) by mouth every 6 (six) hours as needed for  moderate pain, severe pain or breakthrough pain. (Patient not taking: Reported on 12/30/2019) 30 tablet 0   tamsulosin (FLOMAX) 0.4 MG CAPS capsule Take 1 capsule (0.4 mg total) by mouth daily. 30 capsule 0   Tryptophan 500 MG TABS Take 500 mg by mouth at bedtime as needed (sleep).     No current facility-administered medications for this visit.    REVIEW OF SYSTEMS:    10 Point review of Systems was done is negative except as noted above.   PHYSICAL EXAMINATION: ECOG PERFORMANCE STATUS: 1 - Symptomatic but completely ambulatory .BP (!) 151/71   Pulse 75   Temp (!) 97.3 F (36.3 C)   Resp 18   Wt 175 lb 3.2 oz (79.5 kg)   SpO2 99%   BMI 25.14 kg/m   GENERAL:alert, in no acute distress and comfortable SKIN: no  acute rashes, no significant lesions EYES: conjunctiva are pink and non-injected, sclera anicteric OROPHARYNX: MMM, no exudates, no oropharyngeal erythema or ulceration NECK: supple, no JVD LYMPH:  no palpable lymphadenopathy in the cervical, axillary or inguinal regions LUNGS: clear to auscultation b/l with normal respiratory effort HEART: regular rate & rhythm ABDOMEN:  normoactive bowel sounds , non tender, not distended. Extremity: no pedal edema PSYCH: alert & oriented x 3 with fluent speech NEURO: no focal motor/sensory deficits   LABORATORY DATA:  I have reviewed the data as listed .    Latest Ref Rng & Units 09/16/2023   11:58 AM 05/16/2023    1:25 PM 01/08/2023    1:52 PM  CBC  WBC 4.0 - 10.5 K/uL 5.7  4.2  5.1   Hemoglobin 13.0 - 17.0 g/dL 16.1  09.6  04.5   Hematocrit 39.0 - 52.0 % 37.9  39.4  37.0   Platelets 150 - 400 K/uL 241  211  241    .    Latest Ref Rng & Units 09/16/2023   11:58 AM 05/16/2023    1:25 PM 01/08/2023    1:52 PM  CMP  Glucose 70 - 99 mg/dL 409  811  914   BUN 8 - 23 mg/dL 14  15  13    Creatinine 0.61 - 1.24 mg/dL 7.82  9.56  2.13   Sodium 135 - 145 mmol/L 137  138  136   Potassium 3.5 - 5.1 mmol/L 4.1  4.4  3.8   Chloride 98  - 111 mmol/L 104  104  102   CO2 22 - 32 mmol/L 26  26  26    Calcium 8.9 - 10.3 mg/dL 9.2  9.3  9.2   Total Protein 6.5 - 8.1 g/dL 6.6  6.6  6.7   Total Bilirubin <1.2 mg/dL 0.3  0.2  0.2   Alkaline Phos 38 - 126 U/L 49  49  48   AST 15 - 41 U/L 21  16  15    ALT 0 - 44 U/L 16  12  12     PSA <0.1   RADIOGRAPHIC STUDIES: I have personally reviewed the radiological images as listed and agreed with the findings in the report. No results found.  ASSESSMENT & PLAN:   Wonderful 72 y/o male with:  Advanced prostate cancer with lymphadenopathy diagnosed in 2019.  He has castration-sensitive disease.   Androgen deprivation: He is currently on Eligard which will be continued indefinitely under the care of [facility].  Prognosis and goals of care: Aggressive measures are warranted at this time.  His performance status remain excellent and his PSA is undetectable although his disease is incurable.  Hypertension:  This will continue to be monitored on Xtandi.  His blood pressure is within normal range.   PLAN: -Discussed lab results from today, 09/16/2023, in detail with the patient. CBC shows slightly decreased hematocrit of 37.9%, but stable overall. CMP stable  Pt's last PSA was stable and PSA today <0.1 -continue Erleada for prostate cancer management -Patient receives Eligard injections every 6 months under the care of Dr. Alvester Morin -continue to engage in anticancer approaches such as consuming plenty of fresh fruits and vegetables, staying active, and de-stressing -will continue to monitor with labs in 4 months  FOLLOW-UP: RTC with Dr Candise Che with labs in 4 months  The total time spent in the appointment was 20 minutes* .  All of the patient's questions were answered with apparent satisfaction. The patient knows to call the  clinic with any problems, questions or concerns.   Wyvonnia Lora MD MS AAHIVMS [facility] Hematology/Oncology Physician [facility]  .*Total Encounter  Time as defined by the Centers for Medicare and Medicaid Services includes, in addition to the face-to-face time of a patient visit (documented in the note above) non-face-to-face time: obtaining and reviewing outside history, ordering and reviewing medications, tests or procedures, care coordination (communications with other health care professionals or caregivers) and documentation in the medical record.   I,Param Shah,acting as a Neurosurgeon for Wyvonnia Lora, MD.,have documented all relevant documentation on the behalf of Wyvonnia Lora, MD,as directed by  Wyvonnia Lora, MD while in the presence of Wyvonnia Lora, MD.  .I have reviewed the above documentation for accuracy and completeness, and I agree with the above. Johney Maine MD

## 2023-09-17 LAB — PROSTATE-SPECIFIC AG, SERUM (LABCORP): Prostate Specific Ag, Serum: <0.1 ng/mL (ref 0.0–4.0)

## 2023-09-18 ENCOUNTER — Telehealth: Payer: Self-pay | Admitting: Hematology

## 2023-09-18 NOTE — Telephone Encounter (Signed)
Patient is aware of scheduled appointment times/dates

## 2023-09-23 ENCOUNTER — Other Ambulatory Visit: Payer: Self-pay

## 2023-09-23 DIAGNOSIS — C61 Malignant neoplasm of prostate: Secondary | ICD-10-CM

## 2023-09-23 MED ORDER — APALUTAMIDE 240 MG PO TABS: 240.0000 mg | ORAL_TABLET | Freq: Every day | ORAL | 2 refills | Status: DC

## 2023-09-23 NOTE — Telephone Encounter (Signed)
Oral Oncology Patient Advocate Encounter  Called [employer] to confirm receipt of application. Representative at [employer] confirmed it has been received and it is being processed.  I will continue to follow.  Jinger Neighbors, CPhT-Adv Oncology Pharmacy Patient Advocate [name] Direct Number: [phone]  Fax: [phone]

## 2023-10-06 DIAGNOSIS — C61 Malignant neoplasm of prostate: Secondary | ICD-10-CM | POA: Diagnosis not present

## 2023-10-06 NOTE — Telephone Encounter (Signed)
 Oral Oncology Patient Advocate Encounter  Called [employer] to confirm receipt of application. Representative at [employer] confirmed it has been received and it is being processed.  I will continue to follow.  Jinger Neighbors, CPhT-Adv Oncology Pharmacy Patient Advocate [name] Direct Number: [phone]  Fax: [phone]

## 2023-10-06 NOTE — Telephone Encounter (Signed)
Oral Oncology Patient Advocate Encounter   Received notification re-enrollment for assistance for Erleada through [employer] has been approved. Patient may continue to receive their medication at $0 from this program.    [employer] phone number [phone].   Effective dates: 10/06/23 through 10/05/24  I have spoken to the patient.  Jinger Neighbors, CPhT-Adv Oncology Pharmacy Patient Advocate [facility] Direct Number: [phone]  Fax: [phone]

## 2023-10-13 DIAGNOSIS — C775 Secondary and unspecified malignant neoplasm of intrapelvic lymph nodes: Secondary | ICD-10-CM | POA: Diagnosis not present

## 2023-10-13 DIAGNOSIS — C61 Malignant neoplasm of prostate: Secondary | ICD-10-CM | POA: Diagnosis not present

## 2023-11-11 ENCOUNTER — Emergency Department (HOSPITAL_COMMUNITY): Payer: Medicare Other

## 2023-11-11 ENCOUNTER — Other Ambulatory Visit: Payer: Self-pay

## 2023-11-11 ENCOUNTER — Emergency Department (HOSPITAL_COMMUNITY)
Admission: EM | Admit: 2023-11-11 | Discharge: 2023-11-11 | Disposition: A | Discharge status: Home / Self Care | Payer: Medicare Other | Attending: Student | Admitting: Student

## 2023-11-11 ENCOUNTER — Encounter (HOSPITAL_COMMUNITY): Payer: Self-pay

## 2023-11-11 DIAGNOSIS — W19XXXA Unspecified fall, initial encounter: Secondary | ICD-10-CM | POA: Diagnosis not present

## 2023-11-11 DIAGNOSIS — I10 Essential (primary) hypertension: Secondary | ICD-10-CM | POA: Diagnosis not present

## 2023-11-11 DIAGNOSIS — Z23 Encounter for immunization: Secondary | ICD-10-CM | POA: Diagnosis not present

## 2023-11-11 DIAGNOSIS — Y9389 Activity, other specified: Secondary | ICD-10-CM | POA: Diagnosis not present

## 2023-11-11 DIAGNOSIS — S0101XA Laceration without foreign body of scalp, initial encounter: Secondary | ICD-10-CM | POA: Insufficient documentation

## 2023-11-11 DIAGNOSIS — S0990XA Unspecified injury of head, initial encounter: Secondary | ICD-10-CM

## 2023-11-11 DIAGNOSIS — I959 Hypotension, unspecified: Secondary | ICD-10-CM | POA: Diagnosis not present

## 2023-11-11 DIAGNOSIS — S199XXA Unspecified injury of neck, initial encounter: Secondary | ICD-10-CM | POA: Diagnosis not present

## 2023-11-11 DIAGNOSIS — M542 Cervicalgia: Secondary | ICD-10-CM | POA: Insufficient documentation

## 2023-11-11 DIAGNOSIS — R519 Headache, unspecified: Secondary | ICD-10-CM | POA: Diagnosis not present

## 2023-11-11 DIAGNOSIS — R0902 Hypoxemia: Secondary | ICD-10-CM | POA: Diagnosis not present

## 2023-11-11 DIAGNOSIS — Z8546 Personal history of malignant neoplasm of prostate: Secondary | ICD-10-CM | POA: Insufficient documentation

## 2023-11-11 MED ORDER — TETANUS-DIPHTH-ACELL PERTUSSIS 5-2.5-18.5 LF-MCG/0.5 IM SUSY
0.5000 mL | PREFILLED_SYRINGE | Freq: Once | INTRAMUSCULAR | Status: AC
  Administered 2023-11-11: 0.5 mL via INTRAMUSCULAR
  Filled 2023-11-11: qty 0.5

## 2023-11-11 MED ORDER — LIDOCAINE HCL (PF) 1 % IJ SOLN
10.0000 mL | Freq: Once | INTRAMUSCULAR | Status: AC
  Administered 2023-11-11: 10 mL
  Filled 2023-11-11: qty 10

## 2023-11-11 MED ORDER — ACETAMINOPHEN 500 MG PO TABS
1000.0000 mg | ORAL_TABLET | Freq: Once | ORAL | Status: AC
  Administered 2023-11-11: 1000 mg via ORAL
  Filled 2023-11-11: qty 2

## 2023-11-11 NOTE — ED Provider Notes (Signed)
 Pt's care assumed by me at 3:30.  Pt is pending ct head.  Pt has a laceration of his scalp.  Ct head shows no evidence of fracture   .LACERATION REPAIR Performed by: Darice Showers Authorized by: Darice Showers Consent: Verbal consent obtained. Risks and benefits: risks, benefits and alternatives were discussed Consent given by: patient Patient identity confirmed: provided demographic data Prepped and Draped in normal sterile fashion Wound explored  Laceration Location: scalp  Laceration Length: 7cm   No Foreign Bodies seen or palpated  Anesthesia: local infiltration  Local anesthetic: lidocaine    Anesthetic total: 10ml  Irrigation method: syringe Amount of cleaning: standard  Skin closure: staples  Number of staples 11  Technique: staples  Patient tolerance: Patient tolerated the procedure well with no immediate complications.    Showers Sonny POUR, PA-C 11/11/23 1703    Freddi Hamilton, MD 11/11/23 2120

## 2023-11-11 NOTE — ED Provider Notes (Signed)
 [facility] AT [facility] Provider Brown   CSN: [id number] Arrival date & time: 11/11/23  1327     History  Chief Complaint  Patient presents with   Fall    Head laceration    Ph[place]ip Brown is a 73 y.o. male with a past medical history significant for prostate cancer who presents to the ED after a mechanical fall.  Patient tripped over a cord hitting his head on concrete.  No LOC.  Not on any blood thinners.  Patient admits to a right sided headache around the laceration and neck pain.  Denies visual and speech changes.  Denies any weakness.  No nausea or vomiting.  No other injuries.  Patient believes his last tetanus shot was greater than 10 years ago.  History obtained from patient and past medical records. No interpreter used during encounter.       Home Medications Prior to Admission medications   Medication Sig Start Date End Date Taking? Authorizing Provider  acetaminophen  (TYLENOL ) 500 MG tablet Take 500-1,000 mg by mouth every 6 (six) hours as needed (for pain or headaches).    [provider]  apalutamide  (ERLEADA ) 240 MG tablet Take 1 tablet (240 mg total) by mouth daily. 09/23/23   Onesimo Emaline Brink, MD  BLACK PEPPER-TURMERIC PO Take 1 tablet by mouth daily.    [provider]  Leuprolide  Acetate (LUPRON  IJ) Inject 1 Dose as directed every 6 (six) months.    [provider]  Multiple Vitamin (MULTIVITAMIN WITH MINERALS) TABS tablet Take 2 tablets by mouth daily.    [provider]  Multiple Vitamin (MULTIVITAMIN) tablet Take 2 tablets by mouth 2 (two) times daily. bone up calcium enriched multivitamin    [provider]  Multiple Vitamins-Minerals (PRESERVISION AREDS) CAPS Take 1 capsule by mouth daily.    [provider]  naproxen  (NAPROSYN ) 500 MG tablet Take 1 tablet (500 mg total) by mouth 2 (two) times daily with a meal. Patient taking differently: Take 500 mg by mouth 2 (two)  times daily as needed.  04/23/18   Sheldon Standing, MD  OVER THE COUNTER MEDICATION Take 1 tablet by mouth daily as needed (energy). blood builder iron complex    [provider]  oxyCODONE  (OXY IR/ROXICODONE ) 5 MG immediate release tablet Take 1-2 tablets (5-10 mg total) by mouth every 6 (six) hours as needed for moderate pain, severe pain or breakthrough pain. Patient not taking: Reported on 12/30/2019 04/23/18   Sheldon Standing, MD  tamsulosin  (FLOMAX ) 0.4 MG CAPS capsule Take 1 capsule (0.4 mg total) by mouth daily. 11/03/17   Freddi Hamilton, MD  Tryptophan 500 MG TABS Take 500 mg by mouth at bedtime as needed (sleep).    [provider]      Allergies    Patient has no known allergies.    Review of Systems   Review of Systems  Eyes:  Negative for visual disturbance.  Gastrointestinal:  Negative for nausea and vomiting.  Musculoskeletal:  Positive for neck pain.  Neurological:  Positive for headaches. Negative for speech difficulty and weakness.    Physical Exam Updated Vital Signs BP (!) 149/65   Pulse (!) 56   Temp (!) 97.5 F (36.4 C) (Oral)   Resp 16   SpO2 96%  Physical Exam Vitals and nursing Brown reviewed.  Constitutional:      General: He is not in acute distress.    Appearance: He is not ill-appearing.  HENT:  Head: Normocephalic.     Comments: Avulsed flap of skin on right side of scalp. Slightly depressed skull.  Eyes:     Extraocular Movements: Extraocular movements intact.     Pupils: Pupils are equal, round, and reactive to light.  Neck:     Comments: C-collar in place. No cervical midline tenderness Cardiovascular:     Rate and Rhythm: Normal rate and regular rhythm.     Pulses: Normal pulses.     Heart sounds: Normal heart sounds. No murmur heard.    No friction rub. No gallop.  Pulmonary:     Effort: Pulmonary effort is normal.     Breath sounds: Normal breath sounds.  Abdominal:     General: Abdomen is flat. There is no  distension.     Palpations: Abdomen is soft.     Tenderness: There is no abdominal tenderness. There is no guarding or rebound.  Musculoskeletal:        General: Normal range of motion.     Cervical back: Neck supple.  Skin:    General: Skin is warm and dry.  Neurological:     General: No focal deficit present.     Mental Status: He is alert.     Comments: Speech is clear, able to follow commands CN III-XII intact Normal strength in upper and lower extremities bilaterally including dorsiflexion and plantar flexion, strong and equal grip strength Sensation grossly intact throughout Moves extremities without ataxia, coordination intact No pronator drift    Psychiatric:        Mood and Affect: Mood normal.        Behavior: Behavior normal.     ED Results / Procedures / Treatments   Labs (all labs ordered are listed, but only abnormal results are displayed) Labs Reviewed - No data to display  EKG None  Radiology No results found.  Procedures Procedures    Medications Ordered in ED Medications  Tdap (BOOSTRIX ) injection 0.5 mL (0.5 mLs Intramuscular Given 11/11/23 1429)  acetaminophen  (TYLENOL ) tablet 1,000 mg (1,000 mg Oral Given 11/11/23 1429)    ED Course/ Medical Decision Making/ A&P Clinical Course as of 11/11/23 1507  Tue Nov 11, 2023  1425 Called CT scan to expedite scans due to depressed skull. [CA]    Clinical Course User Index [CA] Lorelle Aleck BROCKS, PA-C                                 Medical Decision Making Amount and/or Complexity of Data Reviewed Independent Historian: spouse Radiology: ordered and independent interpretation performed. Decision-making details documented in ED Course.  Risk OTC drugs. Prescription drug management.   This patient presents to the ED for concern of head injury, this involves an extensive number of treatment options, and is a complaint that carries with it a high risk of complications and morbidity.  The  differential diagnosis includes intracranial bleed, skull fracture, laceration, etc  73 year old male presents to the ED after mechanical fall.  Patient fell and hit his head on concrete.  No LOC.  Not any blood thinners.  Last tetanus shot greater than 10 years ago.  No other injuries.  Upon arrival, stable vitals.  Patient in no acute distress.  Laceration to top of scalp with avulsed skin.  Slightly depressed skull.  C-collar in place.  No cervical, thoracic, or lumbar midline tenderness.  Normal neurological exam without any neurological deficits.  CT head and cervical  spine ordered.  Patient requesting Tylenol . Discussed with Dr. Albertina who evaluated patient at bedside and agrees with assessment and plan.   Patient handed off to Darice Showers, PA-C at shift change pending CT images and scalp repair.        Final Clinical Impression(s) / ED Diagnoses Final diagnoses:  Fall, initial encounter  Injury of head, initial encounter  Laceration of scalp, initial encounter    Rx / DC Orders ED Discharge Orders     None         Lorelle Aleck JAYSON DEVONNA 11/11/23 1515    Albertina Dixon, MD 11/11/23 2137

## 2023-11-11 NOTE — ED Triage Notes (Signed)
 PT BIB EMS, fell on concrete working on his car this morning, denies loss of consciousness, and denies blood thinners, complains of head and neck pain.  Laceration to head not currently bleeding.   160/80 HR 54 96% CBG 116

## 2023-11-11 NOTE — Discharge Instructions (Signed)
Staple removal in 8 days.  Return if any problems.  

## 2023-11-19 ENCOUNTER — Encounter: Payer: Self-pay | Admitting: Urgent Care

## 2023-11-19 ENCOUNTER — Ambulatory Visit (INDEPENDENT_AMBULATORY_CARE_PROVIDER_SITE_OTHER): Payer: Medicare Other | Admitting: Urgent Care

## 2023-11-19 VITALS — BP 150/80 | HR 70 | Ht 69.0 in | Wt 175.4 lb

## 2023-11-19 DIAGNOSIS — Z4802 Encounter for removal of sutures: Secondary | ICD-10-CM | POA: Diagnosis not present

## 2023-11-19 DIAGNOSIS — S0101XD Laceration without foreign body of scalp, subsequent encounter: Secondary | ICD-10-CM

## 2023-11-19 DIAGNOSIS — R030 Elevated blood-pressure reading, without diagnosis of hypertension: Secondary | ICD-10-CM

## 2023-11-19 NOTE — Patient Instructions (Addendum)
 Your staples were removed successfully. For the next 48 hours, you may apply OTC bacitracin ointment 1-2x/ day. After 48 hours, no additional treatment is needed. You may shower normally; avoid excessive scrubbing on the scalp until the scab has fallen off on its own. Do not pick the scab off. If any discharge, drainage, pain or swelling occurs at the laceration site, return for recheck.  Monitor your blood pressure at home. Normal is 120/80. If it remains elevated, please return for an assessment.

## 2023-11-20 ENCOUNTER — Encounter: Payer: Self-pay | Admitting: Urgent Care

## 2023-11-20 NOTE — Progress Notes (Signed)
New Patient Office Visit  Subjective:  Patient ID: Phillip Brown, male    DOB: 01/28/1951  Age: 73 y.o. MRN: [id number]  CC:  Chief Complaint  Patient presents with   Follow-up    New pt. Pt is not sure if he will establish care here just yet. He had a fall 8 days ago and needs to have staples removed from his head.    HPI Phillip Brown presents to have staples removed from his scalp. He does not have a PCP at present time, but also deferred establishing with [place] today.  73yo male presents for staple removal of 11 staples to the R frontal region of his scalp. 8 days ago, on 11/11/23, he tripped over a jumper cable while trying to jump start a car, and fell hitting his head on a concrete post. He presented to the ER and had 11 staples placed to the scalp. His wife has been changing the bandage daily and monitoring the area. No concerns at this time, no warmth or drainage. He had a CT head and C spine on 11/11/23 which showed some fluid in the R mastoid, otherwise no acute findings. Pt today reports some tenderness to his B traps, but denies any other discomfort. He denies any ear symptoms or pain to his mastoid.    Outpatient Encounter Medications as of 11/19/2023  Medication Sig   apalutamide (ERLEADA) 240 MG tablet Take 1 tablet (240 mg total) by mouth daily.   enzalutamide (XTANDI) 40 MG capsule Take by mouth.   Leuprolide Acetate (LUPRON IJ) Inject 1 Dose as directed every 6 (six) months.   Multiple Vitamin (MULTIVITAMIN WITH MINERALS) TABS tablet Take 2 tablets by mouth daily.   Multiple Vitamin (MULTIVITAMIN) tablet Take 2 tablets by mouth 2 (two) times daily. "bone up" calcium enriched multivitamin   tamsulosin (FLOMAX) 0.4 MG CAPS capsule Take 1 capsule (0.4 mg total) by mouth daily.   Tryptophan 500 MG TABS Take 500 mg by mouth at bedtime as needed (sleep).   acetaminophen (TYLENOL) 500 MG tablet Take 500-1,000 mg by mouth every 6 (six) hours as needed (for pain or headaches).  (Patient not taking: Reported on 11/19/2023)   BLACK PEPPER-TURMERIC PO Take 1 tablet by mouth daily. (Patient not taking: Reported on 11/19/2023)   Multiple Vitamins-Minerals (PRESERVISION AREDS) CAPS Take 1 capsule by mouth daily. (Patient not taking: Reported on 11/19/2023)   naproxen (NAPROSYN) 500 MG tablet Take 1 tablet (500 mg total) by mouth 2 (two) times daily with a meal. (Patient not taking: Reported on 11/19/2023)   OVER THE COUNTER MEDICATION Take 1 tablet by mouth daily as needed (energy). "blood builder" iron complex (Patient not taking: Reported on 11/19/2023)   oxyCODONE (OXY IR/ROXICODONE) 5 MG immediate release tablet Take 1-2 tablets (5-10 mg total) by mouth every 6 (six) hours as needed for moderate pain, severe pain or breakthrough pain. (Patient not taking: Reported on 11/19/2023)   No facility-administered encounter medications on file as of 11/19/2023.    Past Medical History:  Diagnosis Date   Bilateral inguinal hernia    BPH (benign prostatic hyperplasia)    Prostate cancer (HCC)    being managed by Dr Modena [name] with [facility]    Urinary catheter in place     Past Surgical History:  Procedure Laterality Date   APPENDECTOMY     age 21    HERNIA REPAIR  1980   Inguinal; Dr Patricia Nettle    INGUINAL HERNIA REPAIR Bilateral  04/23/2018   Procedure: LAPAROSCOPIC BILATERAL INGUINAL HERNIA REPAIRS ERAS PATHWAY BILATERAL TAP BLOCK;  Surgeon: Karie Soda, MD;  Location: [facility];  Service: General;  Laterality: Bilateral;   INSERTION OF MESH Bilateral 04/23/2018   Procedure: INSERTION OF MESH;  Surgeon: Karie Soda, MD;  Location: [facility];  Service: General;  Laterality: Bilateral;   LAPAROSCOPIC LYSIS OF ADHESIONS  04/23/2018   Procedure: LAPAROSCOPIC LYSIS OF ADHESIONS;  Surgeon: Karie Soda, MD;  Location: [facility];  Service: General;;   NEPHRECTOMY  age 73   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  Social History   Socioeconomic History   Marital status: Married    Spouse name: Not on file   Number of children: Not on file   Years of education: Not on file   Highest education level: Not on file  Occupational History   Not on file  Tobacco Use   Smoking status: Never   Smokeless tobacco: Never  Substance and Sexual Activity   Alcohol use: Yes    Comment: occasionally    Drug use: No   Sexual activity: Not on file  Other Topics Concern   Not on file  Social History Narrative   Not on file   Social Drivers of Health   Financial Resource Strain: Not on file  Food Insecurity: Not on file  Transportation Needs: Not on file  Physical Activity: Not on file  Stress: Not on file  Social Connections: Not on file  Intimate Partner Violence: Not on file    ROS: as noted in HPI  Objective:  BP (!) 150/80   Pulse 70   Ht 5\' 9"  (1.753 m)   Wt 175 lb 6.4 oz (79.6 kg)   SpO2 98%   BMI 25.90 kg/m   Physical Exam Vitals and nursing note reviewed. Exam conducted with a chaperone present (wife).  Constitutional:      General: He is not in acute distress.    Appearance: Normal appearance. He is normal weight. He is not ill-appearing, toxic-appearing or diaphoretic.  HENT:     Head: Normocephalic. Laceration (Well-healed laceration to R frontal region of scalp; 11 staples in place. Large scab to area without erythema, drainage, warmth) present.     Jaw: No trismus or swelling.      Right Ear: No swelling or tenderness. No middle ear effusion. There is no impacted cerumen. No foreign body. No mastoid tenderness. No hemotympanum. Tympanic membrane is not injected, scarred, perforated or erythematous.  Neurological:     Mental Status: He is alert.     Staple Removal  Date/Time: 11/19/2023 10:40 AM  Performed by: Maretta Bees, PA Authorized by: Maretta Bees, PA  Body area: head/neck Location details: scalp Wound Appearance: clean Staples Removed:  11 Post-removal: antibiotic ointment applied Patient tolerance: patient tolerated the procedure well with no immediate complications      Assessment & Plan:  Laceration of scalp, subsequent encounter -     Suture Removal  Encounter for staple removal -     Suture Removal  Blood pressure elevated without history of HTN  BP elevated today. Pt does not wish to address. He does oncology rather consistently. Feels this is elevated due to anxiety regarding recent fall. Staples were removed here today without any complications. Pt with no signs of mastoiditis or any other concerns.  No follow-ups on file.   Maretta Bees, PA

## 2023-12-15 ENCOUNTER — Other Ambulatory Visit: Payer: Self-pay | Admitting: Hematology

## 2023-12-15 DIAGNOSIS — C61 Malignant neoplasm of prostate: Secondary | ICD-10-CM

## 2024-01-15 ENCOUNTER — Other Ambulatory Visit: Payer: Self-pay

## 2024-01-15 DIAGNOSIS — C61 Malignant neoplasm of prostate: Secondary | ICD-10-CM

## 2024-01-16 ENCOUNTER — Inpatient Hospital Stay: Payer: Medicare Other | Attending: Hematology

## 2024-01-16 ENCOUNTER — Inpatient Hospital Stay (HOSPITAL_BASED_OUTPATIENT_CLINIC_OR_DEPARTMENT_OTHER): Payer: Medicare Other | Admitting: Hematology

## 2024-01-16 VITALS — BP 152/78 | HR 77 | Temp 97.9°F | Resp 16 | Ht 69.0 in | Wt 173.3 lb

## 2024-01-16 DIAGNOSIS — I10 Essential (primary) hypertension: Secondary | ICD-10-CM | POA: Insufficient documentation

## 2024-01-16 DIAGNOSIS — Z800 Family history of malignant neoplasm of digestive organs: Secondary | ICD-10-CM | POA: Diagnosis not present

## 2024-01-16 DIAGNOSIS — C61 Malignant neoplasm of prostate: Secondary | ICD-10-CM

## 2024-01-16 LAB — CBC WITH DIFFERENTIAL (CANCER CENTER ONLY)
Abs Immature Granulocytes: 0.02 10*3/uL (ref 0.00–0.07)
Basophils Absolute: 0 10*3/uL (ref 0.0–0.1)
Basophils Relative: 1 %
Eosinophils Absolute: 0.1 10*3/uL (ref 0.0–0.5)
Eosinophils Relative: 1 %
HCT: 38.1 % — ABNORMAL LOW (ref 39.0–52.0)
Hemoglobin: 12.9 g/dL — ABNORMAL LOW (ref 13.0–17.0)
Immature Granulocytes: 0 %
Lymphocytes Relative: 23 %
Lymphs Abs: 1.3 10*3/uL (ref 0.7–4.0)
MCH: 32.5 pg (ref 26.0–34.0)
MCHC: 33.9 g/dL (ref 30.0–36.0)
MCV: 96 fL (ref 80.0–100.0)
Monocytes Absolute: 0.4 10*3/uL (ref 0.1–1.0)
Monocytes Relative: 7 %
Neutro Abs: 3.9 10*3/uL (ref 1.7–7.7)
Neutrophils Relative %: 68 %
Platelet Count: 253 10*3/uL (ref 150–400)
RBC: 3.97 MIL/uL — ABNORMAL LOW (ref 4.22–5.81)
RDW: 12.2 % (ref 11.5–15.5)
WBC Count: 5.7 10*3/uL (ref 4.0–10.5)
nRBC: 0 % (ref 0.0–0.2)

## 2024-01-16 LAB — CMP (CANCER CENTER ONLY)
ALT: 13 U/L (ref 0–44)
AST: 17 U/L (ref 15–41)
Albumin: 3.9 g/dL (ref 3.5–5.0)
Alkaline Phosphatase: 59 U/L (ref 38–126)
Anion gap: 7 (ref 5–15)
BUN: 16 mg/dL (ref 8–23)
CO2: 25 mmol/L (ref 22–32)
Calcium: 8.8 mg/dL — ABNORMAL LOW (ref 8.9–10.3)
Chloride: 103 mmol/L (ref 98–111)
Creatinine: 0.8 mg/dL (ref 0.61–1.24)
GFR, Estimated: >60 mL/min (ref >60)
Glucose, Bld: 142 mg/dL — ABNORMAL HIGH (ref 70–99)
Potassium: 4 mmol/L (ref 3.5–5.1)
Sodium: 135 mmol/L (ref 135–145)
Total Bilirubin: 0.3 mg/dL (ref 0.0–1.2)
Total Protein: 6.3 g/dL — ABNORMAL LOW (ref 6.5–8.1)

## 2024-01-17 LAB — PROSTATE-SPECIFIC AG, SERUM (LABCORP): Prostate Specific Ag, Serum: <0.1 ng/mL (ref 0.0–4.0)

## 2024-01-19 ENCOUNTER — Telehealth: Payer: Self-pay | Admitting: Hematology

## 2024-01-19 NOTE — Telephone Encounter (Signed)
 Spoke with patient confirming upcoming appointment

## 2024-01-22 NOTE — Progress Notes (Signed)
 HEMATOLOGY/ONCOLOGY CLINIC NOTE  Date of Service: 01/22/2024  Patient Care Team: Default, Provider, MD as PCP - Erline Hau, MD as Consulting Physician (General Surgery) Crista Elliot, MD as Consulting Physician (Urology)  CHIEF COMPLAINTS/PURPOSE OF CONSULTATION:  Follow-up for continued evaluation and management of prostate cancer  HISTORY OF PRESENTING ILLNESS:  Phillip Brown is a wonderful 73 y.o. male who has been a previous patient of Dr. Clelia Croft. He is here for evaluation and management of advanced prostate cancer.  He has castration-sensitive disease after presenting with lymphadenopathy, PSA 118 and Gleason score 4+5 = 9 at the time of diagnosis in 2019.   He was last seen by Dr. Clelia Croft on 09/12/2022 and was doing well overall with no new medical concerns.  Today, he reports that he has been doing well overall and denies any new concerns.  He does complain of exertion-related fatigue. He has not had any previous issues tolerating Xtandi. He reports that he was not off of Xtandi  for an extended period before starting Apalutamide.  He confirms that he previously endorsed urinary retention issues in 2019 and has not received any local treatment for it. His urinary symptoms have since resolved.  He does take vitamin D supplements regularly, but is unsure of the dosage. He denies any back pain, abdominal pain, testicular pain, or testicular swelling. He does have some mild bilateral LE edema which is unchanged from baseline.  He does report a Fhx of cancer in his mother and uncle. His mother had colon cancer in her 76s and passed away at 64. He has no other Fhx of cancers. He denies any concern for skin cancers.   He denies any new bone pains, unexpected weight loss, or other new concerns. His other medications have been stable and he reports that he occasionally takes melatonin.  INTERVAL HISTORY:  Phillip Brown is a 73 y.o. male here for continued evaluation  and management of  advanced prostate cancer.   Patient was last seen by me about 6 months ago.  Patient notes no acute new concerns since his last clinic visit.  He notes some mild fatigue but no other acute new symptoms.  He does report he is taking his Erleada as prescribed and tolerating it well. He continues to receive Eligard every 6 months and has been tolerating it well.   He reports occasional hot flash.  No new focal bone pains.  No fevers no chills no night sweats no unexpected weight loss.  MEDICAL HISTORY:  Past Medical History:  Diagnosis Date   Bilateral inguinal hernia    BPH (benign prostatic hyperplasia)    Prostate cancer (HCC)    being managed by Dr Modena [name] with [facility]    Urinary catheter in place     SURGICAL HISTORY: Past Surgical History:  Procedure Laterality Date   APPENDECTOMY     age 57    HERNIA REPAIR  1980   Inguinal; Dr Patricia Nettle    INGUINAL HERNIA REPAIR Bilateral 04/23/2018   Procedure: LAPAROSCOPIC BILATERAL INGUINAL HERNIA REPAIRS ERAS PATHWAY BILATERAL TAP BLOCK;  Surgeon: Karie Soda, MD;  Location: [facility];  Service: General;  Laterality: Bilateral;   INSERTION OF MESH Bilateral 04/23/2018   Procedure: INSERTION OF MESH;  Surgeon: Karie Soda, MD;  Location: [facility];  Service: General;  Laterality: Bilateral;   LAPAROSCOPIC LYSIS OF ADHESIONS  04/23/2018   Procedure: LAPAROSCOPIC LYSIS OF ADHESIONS;  Surgeon: Karie Soda, MD;  Location: [facility];  Service: General;;   NEPHRECTOMY  age 72   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 and later taken out same time as appendectomy     SOCIAL HISTORY: Social History   Socioeconomic History   Marital status: Married    Spouse name: Not on file   Number of children: Not on file   Years of education: Not on file   Highest education level: Not on file  Occupational History   Not on file  Tobacco Use   Smoking status: Never   Smokeless tobacco: Never  Substance and Sexual Activity    Alcohol use: Yes    Comment: occasionally    Drug use: No   Sexual activity: Not on file  Other Topics Concern   Not on file  Social History Narrative   Not on file   Social Drivers of Health   Financial Resource Strain: Not on file  Food Insecurity: Not on file  Transportation Needs: Not on file  Physical Activity: Not on file  Stress: Not on file  Social Connections: Not on file  Intimate Partner Violence: Not on file    FAMILY HISTORY: No family history on file.  ALLERGIES:  has no known allergies.  MEDICATIONS:  Current Outpatient Medications  Medication Sig Dispense Refill   acetaminophen (TYLENOL) 500 MG tablet Take 500-1,000 mg by mouth every 6 (six) hours as needed (for pain or headaches). (Patient not taking: Reported on 11/19/2023)     BLACK PEPPER-TURMERIC PO Take 1 tablet by mouth daily. (Patient not taking: Reported on 11/19/2023)     enzalutamide (XTANDI) 40 MG capsule Take by mouth.     ERLEADA 240 MG tablet TAKE 1 TABLET BY MOUTH EVERY DAY 30 tablet 2   Leuprolide Acetate (LUPRON IJ) Inject 1 Dose as directed every 6 (six) months.     Multiple Vitamin (MULTIVITAMIN WITH MINERALS) TABS tablet Take 2 tablets by mouth daily.     Multiple Vitamin (MULTIVITAMIN) tablet Take 2 tablets by mouth 2 (two) times daily. "bone up" calcium enriched multivitamin     Multiple Vitamins-Minerals (PRESERVISION AREDS) CAPS Take 1 capsule by mouth daily. (Patient not taking: Reported on 11/19/2023)     naproxen (NAPROSYN) 500 MG tablet Take 1 tablet (500 mg total) by mouth 2 (two) times daily with a meal. (Patient not taking: Reported on 11/19/2023) 40 tablet 1   OVER THE COUNTER MEDICATION Take 1 tablet by mouth daily as needed (energy). "blood builder" iron complex (Patient not taking: Reported on 11/19/2023)     oxyCODONE (OXY IR/ROXICODONE) 5 MG immediate release tablet Take 1-2 tablets (5-10 mg total) by mouth every 6 (six) hours as needed for moderate pain, severe pain or  breakthrough pain. (Patient not taking: Reported on 11/19/2023) 30 tablet 0   tamsulosin (FLOMAX) 0.4 MG CAPS capsule Take 1 capsule (0.4 mg total) by mouth daily. 30 capsule 0   Tryptophan 500 MG TABS Take 500 mg by mouth at bedtime as needed (sleep).     No current facility-administered medications for this visit.    REVIEW OF SYSTEMS:   .10 Point review of Systems was done is negative except as noted above.  PHYSICAL EXAMINATION: ECOG PERFORMANCE STATUS: 1 - Symptomatic but completely ambulatory .BP (!) 152/78 (BP Location: Left Arm, Patient Position: Sitting) Comment: nurse is aware  Pulse 77   Temp 97.9 F (36.6 C) (Temporal)   Resp 16   Ht 5\' 9"  (1.753 m)   Wt 173 lb 4.8 oz (78.6 kg)  SpO2 97%   BMI 25.59 kg/m  . GENERAL:alert, in no acute distress and comfortable SKIN: no acute rashes, no significant lesions EYES: conjunctiva are pink and non-injected, sclera anicteric OROPHARYNX: MMM, no exudates, no oropharyngeal erythema or ulceration NECK: supple, no JVD LYMPH:  no palpable lymphadenopathy in the cervical, axillary or inguinal regions LUNGS: clear to auscultation b/l with normal respiratory effort HEART: regular rate & rhythm ABDOMEN:  normoactive bowel sounds , non tender, not distended. Extremity: no pedal edema PSYCH: alert & oriented x 3 with fluent speech NEURO: no focal motor/sensory deficits   LABORATORY DATA:  I have reviewed the data as listed .    Latest Ref Rng & Units 01/16/2024   12:32 PM 09/16/2023   11:58 AM 05/16/2023    1:25 PM  CBC  WBC 4.0 - 10.5 K/uL 5.7  5.7  4.2   Hemoglobin 13.0 - 17.0 g/dL 16.1  09.6  04.5   Hematocrit 39.0 - 52.0 % 38.1  37.9  39.4   Platelets 150 - 400 K/uL 253  241  211    .    Latest Ref Rng & Units 01/16/2024   12:32 PM 09/16/2023   11:58 AM 05/16/2023    1:25 PM  CMP  Glucose 70 - 99 mg/dL 409  811  914   BUN 8 - 23 mg/dL 16  14  15    Creatinine 0.61 - 1.24 mg/dL 7.82  9.56  2.13   Sodium 135 - 145  mmol/L 135  137  138   Potassium 3.5 - 5.1 mmol/L 4.0  4.1  4.4   Chloride 98 - 111 mmol/L 103  104  104   CO2 22 - 32 mmol/L 25  26  26    Calcium 8.9 - 10.3 mg/dL 8.8  9.2  9.3   Total Protein 6.5 - 8.1 g/dL 6.3  6.6  6.6   Total Bilirubin 0.0 - 1.2 mg/dL 0.3  0.3  0.2   Alkaline Phos 38 - 126 U/L 59  49  49   AST 15 - 41 U/L 17  21  16    ALT 0 - 44 U/L 13  16  12     PSA <0.1   RADIOGRAPHIC STUDIES: I have personally reviewed the radiological images as listed and agreed with the findings in the report. No results found.  ASSESSMENT & PLAN:   Wonderful 73 y/o male with:  Advanced prostate cancer with lymphadenopathy diagnosed in 2019.  He has castration-sensitive disease.   Androgen deprivation: He is currently on Eligard which will be continued indefinitely under the care of [facility].  Prognosis and goals of care: Aggressive measures are warranted at this time.  His performance status remain excellent and his PSA is undetectable although his disease is incurable.  Hypertension:  This will continue to be monitored on Xtandi.  His blood pressure is within normal range.   PLAN: -Discussed lab results from today, 01/16/2023, in detail with the patient.  CBC stable with hemoglobin of 12.9 with a normal WBC count and platelet CMP stable PSA levels today less than 0.1 -Patient has no lab or clinical evidence of prostate cancer progression of this time -continue Erleada for prostate cancer management -Patient receives Eligard injections every 6 months under the care of Dr. Alvester Morin -will continue to monitor with labs in 4 months  FOLLOW-UP: RTC with Dr Candise Che with labs in 4 months  .The total time spent in the appointment was 21 minutes* .  All of the  patient's questions were answered with apparent satisfaction. The patient knows to call the clinic with any problems, questions or concerns.   Wyvonnia Lora MD MS AAHIVMS [facility] Hematology/Oncology Physician [facility]  .*Total Encounter Time as defined by the Centers for Medicare and Medicaid Services includes, in addition to the face-to-face time of a patient visit (documented in the note above) non-face-to-face time: obtaining and reviewing outside history, ordering and reviewing medications, tests or procedures, care coordination (communications with other health care professionals or caregivers) and documentation in the medical record.

## 2024-02-23 ENCOUNTER — Other Ambulatory Visit: Payer: Self-pay | Admitting: Hematology

## 2024-02-23 DIAGNOSIS — C61 Malignant neoplasm of prostate: Secondary | ICD-10-CM

## 2024-04-08 DIAGNOSIS — C61 Malignant neoplasm of prostate: Secondary | ICD-10-CM | POA: Diagnosis not present

## 2024-04-29 DIAGNOSIS — C61 Malignant neoplasm of prostate: Secondary | ICD-10-CM | POA: Diagnosis not present

## 2024-05-03 ENCOUNTER — Other Ambulatory Visit: Payer: Self-pay | Admitting: Hematology

## 2024-05-03 DIAGNOSIS — C61 Malignant neoplasm of prostate: Secondary | ICD-10-CM

## 2024-05-18 ENCOUNTER — Other Ambulatory Visit: Payer: Self-pay

## 2024-05-18 DIAGNOSIS — C61 Malignant neoplasm of prostate: Secondary | ICD-10-CM

## 2024-05-19 ENCOUNTER — Inpatient Hospital Stay: Attending: Hematology | Admitting: Hematology

## 2024-05-19 ENCOUNTER — Inpatient Hospital Stay

## 2024-05-19 VITALS — BP 140/78 | HR 72 | Temp 97.5°F | Resp 18 | Wt 174.7 lb

## 2024-05-19 DIAGNOSIS — Z800 Family history of malignant neoplasm of digestive organs: Secondary | ICD-10-CM | POA: Diagnosis not present

## 2024-05-19 DIAGNOSIS — R600 Localized edema: Secondary | ICD-10-CM | POA: Insufficient documentation

## 2024-05-19 DIAGNOSIS — C61 Malignant neoplasm of prostate: Secondary | ICD-10-CM | POA: Insufficient documentation

## 2024-05-19 DIAGNOSIS — I10 Essential (primary) hypertension: Secondary | ICD-10-CM | POA: Insufficient documentation

## 2024-05-19 DIAGNOSIS — Z79818 Long term (current) use of other agents affecting estrogen receptors and estrogen levels: Secondary | ICD-10-CM | POA: Diagnosis not present

## 2024-05-19 LAB — CBC WITH DIFFERENTIAL (CANCER CENTER ONLY)
Abs Immature Granulocytes: 0.03 K/uL (ref 0.00–0.07)
Basophils Absolute: 0 K/uL (ref 0.0–0.1)
Basophils Relative: 0 %
Eosinophils Absolute: 0.1 K/uL (ref 0.0–0.5)
Eosinophils Relative: 2 %
HCT: 36.7 % — ABNORMAL LOW (ref 39.0–52.0)
Hemoglobin: 12.9 g/dL — ABNORMAL LOW (ref 13.0–17.0)
Immature Granulocytes: 0 %
Lymphocytes Relative: 23 %
Lymphs Abs: 1.7 K/uL (ref 0.7–4.0)
MCH: 33.6 pg (ref 26.0–34.0)
MCHC: 35.1 g/dL (ref 30.0–36.0)
MCV: 95.6 fL (ref 80.0–100.0)
Monocytes Absolute: 0.5 K/uL (ref 0.1–1.0)
Monocytes Relative: 7 %
Neutro Abs: 5.1 K/uL (ref 1.7–7.7)
Neutrophils Relative %: 68 %
Platelet Count: 228 K/uL (ref 150–400)
RBC: 3.84 MIL/uL — ABNORMAL LOW (ref 4.22–5.81)
RDW: 12.4 % (ref 11.5–15.5)
WBC Count: 7.4 K/uL (ref 4.0–10.5)
nRBC: 0 % (ref 0.0–0.2)

## 2024-05-19 LAB — CMP (CANCER CENTER ONLY)
ALT: 11 U/L (ref 0–44)
AST: 15 U/L (ref 15–41)
Albumin: 3.8 g/dL (ref 3.5–5.0)
Alkaline Phosphatase: 50 U/L (ref 38–126)
Anion gap: 6 (ref 5–15)
BUN: 16 mg/dL (ref 8–23)
CO2: 25 mmol/L (ref 22–32)
Calcium: 8.7 mg/dL — ABNORMAL LOW (ref 8.9–10.3)
Chloride: 106 mmol/L (ref 98–111)
Creatinine: 0.76 mg/dL (ref 0.61–1.24)
GFR, Estimated: >60 mL/min (ref >60)
Glucose, Bld: 95 mg/dL (ref 70–99)
Potassium: 4.1 mmol/L (ref 3.5–5.1)
Sodium: 137 mmol/L (ref 135–145)
Total Bilirubin: 0.3 mg/dL (ref 0.0–1.2)
Total Protein: 6.3 g/dL — ABNORMAL LOW (ref 6.5–8.1)

## 2024-05-19 NOTE — Progress Notes (Signed)
 HEMATOLOGY/ONCOLOGY CLINIC NOTE  Date of Service: 05/19/2024  Patient Care Team: Default, Provider, MD as PCP - Diedre Sheldon Standing, MD as Consulting Physician (General Surgery) Carolee Sherwood [name], MD as Consulting Physician (Urology)  CHIEF COMPLAINTS/PURPOSE OF CONSULTATION:  Follow-up for continued evaluation and management of prostate cancer  HISTORY OF PRESENTING ILLNESS:  Phillip Brown is a wonderful 73 y.o. male who has been a previous patient of Dr. Amadeo. He is here for evaluation and management of advanced prostate cancer.  He has castration-sensitive disease after presenting with lymphadenopathy, PSA 118 and Gleason score 4+5 = 9 at the time of diagnosis in 2019.   He was last seen by Dr. Amadeo on 09/12/2022 and was doing well overall with no new medical concerns.  Today, he reports that he has been doing well overall and denies any new concerns.  He does complain of exertion-related fatigue. He has not had any previous issues tolerating Xtandi . He reports that he was not off of Xtandi   for an extended period before starting Apalutamide .  He confirms that he previously endorsed urinary retention issues in 2019 and has not received any local treatment for it. His urinary symptoms have since resolved.  He does take vitamin D supplements regularly, but is unsure of the dosage. He denies any back pain, abdominal pain, testicular pain, or testicular swelling. He does have some mild bilateral LE edema which is unchanged from baseline.  He does report a Fhx of cancer in his mother and uncle. His mother had colon cancer in her 55s and passed away at 67. He has no other Fhx of cancers. He denies any concern for skin cancers.   He denies any new bone pains, unexpected weight loss, or other new concerns. His other medications have been stable and he reports that he occasionally takes melatonin.  INTERVAL HISTORY:  Phillip Brown is a 73 y.o. male here for continued evaluation  and management of advanced prostate cancer.   Patient was last seen by me on 01/16/2024 and reported mild fatigue and occasional hot flashes.  He reports no new concerns in the last 3-4 months. Patient denies any new urinary symptoms, change in color of his urine, discomfort passing urine, local abdominal distention, leg swelling, new SOB, chest pain, new bone pain such as in the spine/pelvis/shoulder, or change in bowel habits.   Patient reports that he is tolerating his treatment well with no toxicities, including Eligard  managed by Dr. Carolee and Erleada  managed by us .   He reports that he regularly follows up with Dr. Carolee once every 6 months. He notes that he last saw Dr. Carolee 6 weeks ago.   Patient denies any issues with receiving his Erleada  on time.   He reports that he has not had any hx of primary prostate treatment such as surgery or radiation. Patient notes that at the time, these interventions were not discussed with him.   He reports that he is UTD with his age-appropriate cancer screenings.   He denies being started on any other new medications since his last clinical visit.   His blood pressures is noted to be fairly stable.   MEDICAL HISTORY:  Past Medical History:  Diagnosis Date   Bilateral inguinal hernia    BPH (benign prostatic hyperplasia)    Prostate cancer (HCC)    being managed by Dr Sherwood Carolee with [facility]    Urinary catheter in place     SURGICAL HISTORY: Past Surgical History:  Procedure Laterality Date   APPENDECTOMY     age 40    HERNIA REPAIR  1980   Inguinal; Dr Darold Finn    INGUINAL HERNIA REPAIR Bilateral 04/23/2018   Procedure: LAPAROSCOPIC BILATERAL INGUINAL HERNIA REPAIRS ERAS PATHWAY BILATERAL TAP BLOCK;  Surgeon: Sheldon Standing, MD;  Location: [facility];  Service: General;  Laterality: Bilateral;   INSERTION OF MESH Bilateral 04/23/2018   Procedure: INSERTION OF MESH;  Surgeon: Sheldon Standing, MD;  Location: [facility];  Service:  General;  Laterality: Bilateral;   LAPAROSCOPIC LYSIS OF ADHESIONS  04/23/2018   Procedure: LAPAROSCOPIC LYSIS OF ADHESIONS;  Surgeon: Sheldon Standing, MD;  Location: [facility];  Service: General;;   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     SOCIAL HISTORY: Social History   Socioeconomic History   Marital status: Married    Spouse name: Not on file   Number of children: Not on file   Years of education: Not on file   Highest education level: Not on file  Occupational History   Not on file  Tobacco Use   Smoking status: Never   Smokeless tobacco: Never  Substance and Sexual Activity   Alcohol use: Yes    Comment: occasionally    Drug use: No   Sexual activity: Not on file  Other Topics Concern   Not on file  Social History Narrative   Not on file   Social Drivers of Health   Financial Resource Strain: Not on file  Food Insecurity: Not on file  Transportation Needs: Not on file  Physical Activity: Not on file  Stress: Not on file  Social Connections: Not on file  Intimate Partner Violence: Not on file    FAMILY HISTORY: No family history on file.  ALLERGIES:  has no known allergies.  MEDICATIONS:  Current Outpatient Medications  Medication Sig Dispense Refill   acetaminophen  (TYLENOL ) 500 MG tablet Take 500-1,000 mg by mouth every 6 (six) hours as needed (for pain or headaches). (Patient not taking: Reported on 11/19/2023)     BLACK PEPPER-TURMERIC PO Take 1 tablet by mouth daily. (Patient not taking: Reported on 11/19/2023)     enzalutamide  (XTANDI ) 40 MG capsule Take by mouth.     ERLEADA  240 MG tablet TAKE 1 TABLET BY MOUTH EVERY DAY 30 tablet 2   Leuprolide  Acetate (LUPRON  IJ) Inject 1 Dose as directed every 6 (six) months.     Multiple Vitamin (MULTIVITAMIN WITH MINERALS) TABS tablet Take 2 tablets by mouth daily.     Multiple Vitamin (MULTIVITAMIN) tablet Take 2 tablets by mouth 2 (two) times daily. bone up  calcium enriched multivitamin     Multiple Vitamins-Minerals (PRESERVISION AREDS) CAPS Take 1 capsule by mouth daily. (Patient not taking: Reported on 11/19/2023)     naproxen  (NAPROSYN ) 500 MG tablet Take 1 tablet (500 mg total) by mouth 2 (two) times daily with a meal. (Patient not taking: Reported on 11/19/2023) 40 tablet 1   OVER THE COUNTER MEDICATION Take 1 tablet by mouth daily as needed (energy). blood builder iron complex (Patient not taking: Reported on 11/19/2023)     oxyCODONE  (OXY IR/ROXICODONE ) 5 MG immediate release tablet Take 1-2 tablets (5-10 mg total) by mouth every 6 (six) hours as needed for moderate pain, severe pain or breakthrough pain. (Patient not taking: Reported on 11/19/2023) 30 tablet 0   tamsulosin  (FLOMAX ) 0.4 MG CAPS capsule Take 1 capsule (0.4 mg total) by  mouth daily. 30 capsule 0   Tryptophan 500 MG TABS Take 500 mg by mouth at bedtime as needed (sleep).     No current facility-administered medications for this visit.    REVIEW OF SYSTEMS:   .10 Point review of Systems was done is negative except as noted above.  PHYSICAL EXAMINATION: ECOG PERFORMANCE STATUS: 1 - Symptomatic but completely ambulatory .BP (!) 140/78   Pulse 72   Temp (!) 97.5 F (36.4 C)   Resp 18   Wt 174 lb 11.2 oz (79.2 kg)   SpO2 96%   BMI 25.80 kg/m  GENERAL:alert, in no acute distress and comfortable SKIN: no acute rashes, no significant lesions EYES: conjunctiva are pink and non-injected, sclera anicteric OROPHARYNX: MMM, no exudates, no oropharyngeal erythema or ulceration NECK: supple, no JVD LYMPH:  no palpable lymphadenopathy in the cervical, axillary or inguinal regions LUNGS: clear to auscultation b/l with normal respiratory effort HEART: regular rate & rhythm ABDOMEN:  normoactive bowel sounds , non tender, not distended. Extremity: no pedal edema PSYCH: alert & oriented x 3 with fluent speech NEURO: no focal motor/sensory deficits   LABORATORY DATA:  I have  reviewed the data as listed .    Latest Ref Rng & Units 05/19/2024   12:58 PM 01/16/2024   12:32 PM 09/16/2023   11:58 AM  CBC  WBC 4.0 - 10.5 K/uL 7.4  5.7  5.7   Hemoglobin 13.0 - 17.0 g/dL 87.0  87.0  86.6   Hematocrit 39.0 - 52.0 % 36.7  38.1  37.9   Platelets 150 - 400 K/uL 228  253  241    .    Latest Ref Rng & Units 05/19/2024   12:58 PM 01/16/2024   12:32 PM 09/16/2023   11:58 AM  CMP  Glucose 70 - 99 mg/dL 95  857  898   BUN 8 - 23 mg/dL 16  16  14    Creatinine 0.61 - 1.24 mg/dL 9.23  9.19  9.16   Sodium 135 - 145 mmol/L 137  135  137   Potassium 3.5 - 5.1 mmol/L 4.1  4.0  4.1   Chloride 98 - 111 mmol/L 106  103  104   CO2 22 - 32 mmol/L 25  25  26    Calcium 8.9 - 10.3 mg/dL 8.7  8.8  9.2   Total Protein 6.5 - 8.1 g/dL 6.3  6.3  6.6   Total Bilirubin 0.0 - 1.2 mg/dL 0.3  0.3  0.3   Alkaline Phos 38 - 126 U/L 50  59  49   AST 15 - 41 U/L 15  17  21    ALT 0 - 44 U/L 11  13  16     PSA <0.1   RADIOGRAPHIC STUDIES: I have personally reviewed the radiological images as listed and agreed with the findings in the report. No results found.  ASSESSMENT & PLAN:   Wonderful 73 y/o male with:  Advanced prostate cancer with lymphadenopathy diagnosed in 2019.  He has castration-sensitive disease.   Androgen deprivation: He is currently on Eligard  which will be continued indefinitely under the care of [facility].  Prognosis and goals of care: Aggressive measures are warranted at this time.  His performance status remain excellent and his PSA is undetectable although his disease is incurable.  Hypertension:  This will continue to be monitored on Xtandi .  His blood pressure is within normal range.   PLAN:  -Discussed lab results on 05/19/24 in detail with patient.  CBC normal, showed WBC of 7.4K, hemoglobin of 12.9, and platelets of 228K. -CMP fairly normal -his last PSA level from March 2025 was undetectable -PSA level today is pending -will follow-up on this -Blood  pressure stable -Patient has no lab or clinical evidence of prostate cancer progression of this time  -discussed that if localized treatment was done, then there would be consideration of combination treatment for two years with medications such as Eligard  and Erleada  -Patient reportedly has not had any hx of primary prostate treatment such as surgery or radiation -discussed that if there was no local treatment, then he will continue his current treatment regimen -continue Erleada  for locally advanced prostate cancer management -he continues to receive Eligard  injections every 6 months managed by Dr. Carolee -discussed that there are options of other potential treatments down the line if needed such as BiTE therapies or Pluvicto if needed.  -continue to stay UTD with age-appropriate cancer screenings with PCP, including colonoscopies -answered all of patient's questions in detail -he shall return to clinic in 4-6 months  FOLLOW-UP: RTC with Dr Onesimo with labs in 4-5 months  The total time spent in the appointment was 21 minutes* .  All of the patient's questions were answered with apparent satisfaction. The patient knows to call the clinic with any problems, questions or concerns.   Emaline Onesimo MD MS AAHIVMS [facility] Hematology/Oncology Physician [facility]  .*Total Encounter Time as defined by the Centers for Medicare and Medicaid Services includes, in addition to the face-to-face time of a patient visit (documented in the note above) non-face-to-face time: obtaining and reviewing outside history, ordering and reviewing medications, tests or procedures, care coordination (communications with other health care professionals or caregivers) and documentation in the medical record.    I,Mitra Faeizi,acting as a Neurosurgeon for Emaline Onesimo, MD.,have documented all relevant documentation on the behalf of Emaline Onesimo, MD,as directed by  Emaline Onesimo, MD while in the presence of Emaline Onesimo,  MD.  .I have reviewed the above documentation for accuracy and completeness, and I agree with the above. .Callen Vancuren Kishore Ramia Sidney MD

## 2024-05-20 LAB — PROSTATE-SPECIFIC AG, SERUM (LABCORP): Prostate Specific Ag, Serum: <0.1 ng/mL (ref 0.0–4.0)

## 2024-07-19 ENCOUNTER — Other Ambulatory Visit: Payer: Self-pay | Admitting: Hematology

## 2024-07-19 DIAGNOSIS — C61 Malignant neoplasm of prostate: Secondary | ICD-10-CM

## 2024-07-21 DIAGNOSIS — H02831 Dermatochalasis of right upper eyelid: Secondary | ICD-10-CM | POA: Diagnosis not present

## 2024-07-21 DIAGNOSIS — H02834 Dermatochalasis of left upper eyelid: Secondary | ICD-10-CM | POA: Diagnosis not present

## 2024-07-21 DIAGNOSIS — H04123 Dry eye syndrome of bilateral lacrimal glands: Secondary | ICD-10-CM | POA: Diagnosis not present

## 2024-07-21 DIAGNOSIS — H40023 Open angle with borderline findings, high risk, bilateral: Secondary | ICD-10-CM | POA: Diagnosis not present

## 2024-07-21 DIAGNOSIS — H40033 Anatomical narrow angle, bilateral: Secondary | ICD-10-CM | POA: Diagnosis not present

## 2024-07-21 DIAGNOSIS — H11823 Conjunctivochalasis, bilateral: Secondary | ICD-10-CM | POA: Diagnosis not present

## 2024-07-21 DIAGNOSIS — H2513 Age-related nuclear cataract, bilateral: Secondary | ICD-10-CM | POA: Diagnosis not present

## 2024-07-27 ENCOUNTER — Other Ambulatory Visit (HOSPITAL_COMMUNITY): Payer: Self-pay

## 2024-09-13 ENCOUNTER — Other Ambulatory Visit: Payer: Self-pay

## 2024-09-13 DIAGNOSIS — C61 Malignant neoplasm of prostate: Secondary | ICD-10-CM

## 2024-10-04 ENCOUNTER — Other Ambulatory Visit: Payer: Self-pay | Admitting: Hematology

## 2024-10-04 DIAGNOSIS — C61 Malignant neoplasm of prostate: Secondary | ICD-10-CM

## 2024-10-07 ENCOUNTER — Other Ambulatory Visit: Payer: Self-pay

## 2024-10-07 DIAGNOSIS — C61 Malignant neoplasm of prostate: Secondary | ICD-10-CM

## 2024-10-08 ENCOUNTER — Inpatient Hospital Stay: Admitting: Hematology

## 2024-10-08 ENCOUNTER — Inpatient Hospital Stay: Attending: Hematology

## 2024-10-08 VITALS — BP 138/70 | HR 80 | Temp 98.1°F | Resp 17 | Wt 173.4 lb

## 2024-10-08 DIAGNOSIS — Z800 Family history of malignant neoplasm of digestive organs: Secondary | ICD-10-CM | POA: Diagnosis not present

## 2024-10-08 DIAGNOSIS — C61 Malignant neoplasm of prostate: Secondary | ICD-10-CM | POA: Insufficient documentation

## 2024-10-08 DIAGNOSIS — Z79818 Long term (current) use of other agents affecting estrogen receptors and estrogen levels: Secondary | ICD-10-CM | POA: Insufficient documentation

## 2024-10-08 DIAGNOSIS — I10 Essential (primary) hypertension: Secondary | ICD-10-CM | POA: Insufficient documentation

## 2024-10-08 LAB — CBC WITH DIFFERENTIAL (CANCER CENTER ONLY)
Abs Immature Granulocytes: 0.01 K/uL (ref 0.00–0.07)
Basophils Absolute: 0 K/uL (ref 0.0–0.1)
Basophils Relative: 1 %
Eosinophils Absolute: 0.2 K/uL (ref 0.0–0.5)
Eosinophils Relative: 3 %
HCT: 38.3 % — ABNORMAL LOW (ref 39.0–52.0)
Hemoglobin: 13.3 g/dL (ref 13.0–17.0)
Immature Granulocytes: 0 %
Lymphocytes Relative: 30 %
Lymphs Abs: 1.5 K/uL (ref 0.7–4.0)
MCH: 33.7 pg (ref 26.0–34.0)
MCHC: 34.7 g/dL (ref 30.0–36.0)
MCV: 97 fL (ref 80.0–100.0)
Monocytes Absolute: 0.4 K/uL (ref 0.1–1.0)
Monocytes Relative: 7 %
Neutro Abs: 3 K/uL (ref 1.7–7.7)
Neutrophils Relative %: 59 %
Platelet Count: 246 K/uL (ref 150–400)
RBC: 3.95 MIL/uL — ABNORMAL LOW (ref 4.22–5.81)
RDW: 12.5 % (ref 11.5–15.5)
WBC Count: 5.1 K/uL (ref 4.0–10.5)
nRBC: 0 % (ref 0.0–0.2)

## 2024-10-08 LAB — CMP (CANCER CENTER ONLY)
ALT: 13 U/L (ref 0–44)
AST: 20 U/L (ref 15–41)
Albumin: 4.1 g/dL (ref 3.5–5.0)
Alkaline Phosphatase: 56 U/L (ref 38–126)
Anion gap: 11 (ref 5–15)
BUN: 12 mg/dL (ref 8–23)
CO2: 24 mmol/L (ref 22–32)
Calcium: 9 mg/dL (ref 8.9–10.3)
Chloride: 101 mmol/L (ref 98–111)
Creatinine: 0.77 mg/dL (ref 0.61–1.24)
GFR, Estimated: >60 mL/min (ref >60)
Glucose, Bld: 128 mg/dL — ABNORMAL HIGH (ref 70–99)
Potassium: 4.1 mmol/L (ref 3.5–5.1)
Sodium: 137 mmol/L (ref 135–145)
Total Bilirubin: <0.2 mg/dL (ref 0.0–1.2)
Total Protein: 6.5 g/dL (ref 6.5–8.1)

## 2024-10-08 LAB — PSA: Prostatic Specific Antigen: <0.02 ng/mL (ref 0.00–4.00)

## 2024-10-08 NOTE — Progress Notes (Signed)
 HEMATOLOGY ONCOLOGY PROGRESS NOTE  Date of service: 10/08/2024  Patient Care Team: Default, Provider, MD as PCP - Phillip Phillip Standing, MD as Consulting Physician (General Surgery) Phillip Brown Phillip DOUGLAS, MD as Consulting Physician (Urology)  CHIEF COMPLAINT/PURPOSE OF CONSULTATION: Follow-up for continued evaluation and management of Prostate Cancer  HISTORY OF PRESENTING ILLNESS: Phillip Brown is a wonderful 73 y.o. male who has been a previous patient of Phillip Brown. He is here for evaluation and management of advanced prostate cancer.   He has castration-sensitive disease after presenting with lymphadenopathy, PSA 118 and Gleason score 4+5 = 9 at the time of diagnosis in 2019.    He was last seen by Phillip Brown on 09/12/2022 and was doing well overall with no new medical concerns.   Today, he reports that he has been doing well overall and denies any new concerns.  He does complain of exertion-related fatigue. He has not had any previous issues tolerating Xtandi . He reports that he was not off of Xtandi   for an extended period before starting Apalutamide .   He confirms that he previously endorsed urinary retention issues in 2019 and has not received any local treatment for it. His urinary symptoms have since resolved.   He does take vitamin D supplements regularly, but is unsure of the dosage. He denies any back pain, abdominal pain, testicular pain, or testicular swelling. He does have some mild bilateral LE edema which is unchanged from baseline.   He does report a Fhx of cancer in his mother and uncle. His mother had colon cancer in her 43s and passed away at 76. He has no other Fhx of cancers. He denies any concern for skin cancers.    He denies any new bone pains, unexpected weight loss, or other new concerns. His other medications have been stable and he reports that he occasionally takes melatonin.  INTERVAL HISTORY: Phillip Brown is a 73 y.o. male who is here today for  continued evaluation and management of Prostate Cancer.  he was last seen by me on 05/19/2024; at the time he did not have any concerns and was doing well.   Today, he says that he is doing well. He notes no acute new symptoms. No change in urination, no hematuria, Weight has been stable Good po intake. Concerned about insurance coverage of his Apalutamide .  REVIEW OF SYSTEMS:   10 Point review of systems of done and is negative except as noted above.  MEDICAL HISTORY Past Medical History:  Diagnosis Date   Bilateral inguinal hernia    BPH (benign prostatic hyperplasia)    Prostate cancer (HCC)    being managed by Dr Phillip Brown with [facility]    Urinary catheter in place     SURGICAL HISTORY Past Surgical History:  Procedure Laterality Date   APPENDECTOMY     age 34    HERNIA REPAIR  1980   Inguinal; Dr Phillip Brown    INGUINAL HERNIA REPAIR Bilateral 04/23/2018   Procedure: LAPAROSCOPIC BILATERAL INGUINAL HERNIA REPAIRS ERAS PATHWAY BILATERAL TAP BLOCK;  Surgeon: Phillip Standing, MD;  Location: [facility];  Service: General;  Laterality: Bilateral;   INSERTION OF MESH Bilateral 04/23/2018   Procedure: INSERTION OF MESH;  Surgeon: Phillip Standing, MD;  Location: [facility];  Service: General;  Laterality: Bilateral;   LAPAROSCOPIC LYSIS OF ADHESIONS  04/23/2018   Procedure: LAPAROSCOPIC LYSIS OF ADHESIONS;  Surgeon: Phillip Standing, MD;  Location: [facility];  Service: General;;   NEPHRECTOMY  age  2   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   ALLERGIES: has no known  allergies.  MEDICATIONS  Current Outpatient Medications  Medication Sig Dispense Refill   acetaminophen  (TYLENOL ) 500 MG tablet Take 500-1,000 mg by mouth every 6 (six) hours as needed (for pain or headaches). (Patient not taking: Reported on 11/19/2023)     BLACK PEPPER-TURMERIC PO Take 1 tablet by mouth daily. (Patient not taking: Reported on 11/19/2023)     enzalutamide  (XTANDI ) 40 MG capsule Take by mouth.     ERLEADA  240 MG tablet TAKE 1 TABLET BY MOUTH EVERY DAY 30 tablet 2   Leuprolide  Acetate (LUPRON  IJ) Inject 1 Dose as directed every 6 (six) months.     Multiple Vitamin (MULTIVITAMIN WITH MINERALS) TABS tablet Take 2 tablets by mouth daily.     Multiple Vitamin (MULTIVITAMIN) tablet Take 2 tablets by mouth 2 (two) times daily. bone up calcium enriched multivitamin     Multiple Vitamins-Minerals (PRESERVISION AREDS) CAPS Take 1 capsule by mouth daily. (Patient not taking: Reported on 11/19/2023)     naproxen  (NAPROSYN ) 500 MG tablet Take 1 tablet (500 mg total) by mouth 2 (two) times daily with a meal. (Patient not taking: Reported on 11/19/2023) 40 tablet 1   OVER THE COUNTER MEDICATION Take 1 tablet by mouth daily as needed (energy). blood builder iron complex (Patient not taking: Reported on 11/19/2023)     oxyCODONE  (OXY IR/ROXICODONE ) 5 MG immediate release tablet Take 1-2 tablets (5-10 mg total) by mouth every 6 (six) hours as needed for moderate pain, severe pain or breakthrough pain. (Patient not taking: Reported on 11/19/2023) 30 tablet 0   tamsulosin  (FLOMAX ) 0.4 MG CAPS capsule Take 1 capsule (0.4 mg total) by mouth daily. 30 capsule 0   Tryptophan 500 MG TABS Take 500 mg by mouth at bedtime as needed (sleep).     No current facility-administered medications for this visit.    PHYSICAL EXAMINATION: ECOG PERFORMANCE STATUS: 1 - Symptomatic but completely ambulatory VITALS: Vitals:   10/08/24 1334  BP: 138/70  Pulse: 80  Resp: 17  Temp: 98.1 F (36.7 C)  SpO2: 95%    Filed Weights   10/08/24 1334  Weight: 173 lb 6.4 oz (78.7 kg)   Body mass index is 25.61 kg/m.  GENERAL: alert, in no acute distress and comfortable SKIN: no acute rashes, no significant lesions EYES: conjunctiva are pink and non-injected, sclera anicteric OROPHARYNX: MMM, no exudates, no oropharyngeal erythema or ulceration NECK: supple, no JVD LYMPH:  no palpable lymphadenopathy in the cervical, axillary or inguinal regions LUNGS: clear to auscultation b/l with normal respiratory effort HEART: regular rate & rhythm ABDOMEN:  normoactive bowel sounds , non tender, not distended, no hepatosplenomegaly Extremity: no pedal edema PSYCH: alert & oriented x 3 with fluent speech NEURO: no focal motor/sensory deficits  LABORATORY DATA:   I have reviewed the data as listed     Latest Ref Rng & Units 10/08/2024    1:03 PM  05/19/2024   12:58 PM 01/16/2024   12:32 PM  CBC EXTENDED  WBC 4.0 - 10.5 K/uL 5.1  7.4  5.7   RBC 4.22 - 5.81 MIL/uL 3.95  3.84  3.97   Hemoglobin 13.0 - 17.0 g/dL 86.6  87.0  87.0   HCT 39.0 - 52.0 % 38.3  36.7  38.1   Platelets 150 - 400 K/uL 246  228  253   NEUT# 1.7 - 7.7 K/uL 3.0  5.1  3.9   Lymph# 0.7 - 4.0 K/uL 1.5  1.7  1.3     10/08/2024  Prostatic Specific Antigen     0.00 - 4.00 ng/mL <0.02       Latest Ref Rng & Units 10/08/2024    1:03 PM 05/19/2024   12:58 PM 01/16/2024   12:32 PM  CMP  Glucose 70 - 99 mg/dL 871  95  857   BUN 8 - 23 mg/dL 12  16  16    Creatinine 0.61 - 1.24 mg/dL 9.22  9.23  9.19   Sodium 135 - 145 mmol/L 137  137  135   Potassium 3.5 - 5.1 mmol/L 4.1  4.1  4.0   Chloride 98 - 111 mmol/L 101  106  103   CO2 22 - 32 mmol/L 24  25  25    Calcium 8.9 - 10.3 mg/dL 9.0  8.7  8.8   Total Protein 6.5 - 8.1 g/dL 6.5  6.3  6.3   Total Bilirubin 0.0 - 1.2 mg/dL <9.7  0.3  0.3   Alkaline Phos 38 - 126 U/L 56  50  59   AST 15 - 41 U/L 20  15  17    ALT 0 - 44 U/L 13  11  13       RADIOGRAPHIC STUDIES: I have personally reviewed  the radiological images as listed and agreed with the findings in the report. No results found.  ASSESSMENT & PLAN:  73 y.o. male with  Advanced prostate cancer with lymphadenopathy diagnosed in 2019.  He has castration-sensitive disease.   Androgen deprivation: He is currently on Eligard  which will be continued indefinitely under the care of [facility].  Prognosis and goals of care: Aggressive measures are warranted at this time.  His performance status remain excellent and his PSA is undetectable although his disease is incurable.  Hypertension:  This will continue to be monitored on Xtandi .  His blood pressure is within normal range.    PLAN: - Discussed lab results on 10/08/2024 in detail with patient: CBC showed WBC of 5.1K, Hemoglobin of 13.3, and PLTs of 246K. CMP with Glucose 128 increased from 95.  PSA <0.02 No lab or clinical findings suggestive of prostate cancer progression at this time. No notable toxicties from his current treatment. Will plan to continue Erleada  240mg  po daily Continue Eligard  as schedule at [facility].  FOLLOW-UP in 4-5 months for labs and follow-up with Dr. Onesimo.  The total time spent in the appointment was 20 minutes* .  All of the patient's questions were answered and the patient knows to call the clinic with any problems, questions, or concerns.  Emaline Onesimo MD MS AAHIVMS [facility] Hematology/Oncology Physician [facility]  *Total Encounter Time as defined by the Centers for Medicare and Medicaid Services includes, in addition to the face-to-face time of a patient visit (documented in the note above) non-face-to-face time: obtaining and reviewing outside history, ordering and reviewing medications, tests or procedures, care coordination (communications with other  health care professionals or caregivers) and documentation in the medical record.  I,Emily Lagle,acting as a neurosurgeon for Emaline Saran, MD.,have documented all  relevant documentation on the behalf of Emaline Saran, MD,as directed by  Emaline Saran, MD while in the presence of Emaline Saran, MD.  I have reviewed the above documentation for accuracy and completeness, and I agree with the above.  Levenia Skalicky, MD

## 2024-10-20 ENCOUNTER — Telehealth: Payer: Self-pay

## 2024-10-20 NOTE — Telephone Encounter (Signed)
 Oral Oncology Patient Advocate Encounter  Left him a message to contact [facility] for re-enrollment for next year, they have tried to contact him and have not gotten a response yet. They must speak with him if he wants to continue getting the medication for free from them. Their number is [phone].  Lucie Lamer, CPhT [place]  [facility] Oncology Pharmacy Patient Advocate Specialist [facility] [place] Phone: [phone]  Fax: [phone] [email or web address]

## 2024-11-10 ENCOUNTER — Other Ambulatory Visit (HOSPITAL_COMMUNITY): Payer: Self-pay

## 2024-11-10 NOTE — Telephone Encounter (Signed)
 Oral Oncology Patient Advocate Encounter  We submitted a letter of [place] on 12/18.25 to see if we can get an approval. I called today to check the status, it is still pending.  Lucie Lamer, CPhT [place]  [facility] Oncology Pharmacy Patient Advocate Specialist II [facility] [place] Phone: [phone]  Fax: [phone] [email or web address]

## 2025-02-09 ENCOUNTER — Inpatient Hospital Stay: Admitting: Hematology

## 2025-02-09 ENCOUNTER — Inpatient Hospital Stay
